# Patient Record
Sex: Female | Born: 1970
Health system: Southern US, Community
[De-identification: ages and names within clinical notes are randomized; demographics above are authoritative.]

## PROBLEM LIST (undated history)

## (undated) DIAGNOSIS — G43909 Migraine, unspecified, not intractable, without status migrainosus: Secondary | ICD-10-CM

## (undated) DIAGNOSIS — C801 Malignant (primary) neoplasm, unspecified: Secondary | ICD-10-CM

## (undated) DIAGNOSIS — G510 Bell's palsy: Secondary | ICD-10-CM

## (undated) DIAGNOSIS — F419 Anxiety disorder, unspecified: Secondary | ICD-10-CM

## (undated) DIAGNOSIS — A64 Unspecified sexually transmitted disease: Secondary | ICD-10-CM

## (undated) HISTORY — DX: Unspecified sexually transmitted disease: A64

## (undated) HISTORY — PX: BARTHOLIN GLAND CYST EXCISION: SHX565

## (undated) HISTORY — DX: Malignant (primary) neoplasm, unspecified: C80.1

## (undated) HISTORY — DX: Bell's palsy: G51.0

## (undated) HISTORY — DX: Migraine, unspecified, not intractable, without status migrainosus: G43.909

## (undated) HISTORY — DX: Anxiety disorder, unspecified: F41.9

---

## 1999-06-06 ENCOUNTER — Other Ambulatory Visit: Admission: RE | Admit: 1999-06-06 | Discharge: 1999-06-06 | Payer: Self-pay | Admitting: *Deleted

## 2000-06-25 ENCOUNTER — Other Ambulatory Visit: Admission: RE | Admit: 2000-06-25 | Discharge: 2000-06-25 | Payer: Self-pay | Admitting: *Deleted

## 2001-07-14 ENCOUNTER — Other Ambulatory Visit: Admission: RE | Admit: 2001-07-14 | Discharge: 2001-07-14 | Payer: Self-pay | Admitting: *Deleted

## 2002-06-01 ENCOUNTER — Inpatient Hospital Stay (HOSPITAL_COMMUNITY): Admission: AD | Admit: 2002-06-01 | Discharge: 2002-06-04 | Payer: Self-pay | Admitting: *Deleted

## 2002-07-16 ENCOUNTER — Other Ambulatory Visit: Admission: RE | Admit: 2002-07-16 | Discharge: 2002-07-16 | Payer: Self-pay | Admitting: *Deleted

## 2003-09-13 ENCOUNTER — Other Ambulatory Visit: Admission: RE | Admit: 2003-09-13 | Discharge: 2003-09-13 | Payer: Self-pay | Admitting: *Deleted

## 2004-08-21 ENCOUNTER — Inpatient Hospital Stay (HOSPITAL_COMMUNITY): Admission: RE | Admit: 2004-08-21 | Discharge: 2004-08-24 | Payer: Self-pay | Admitting: Obstetrics and Gynecology

## 2004-09-25 ENCOUNTER — Other Ambulatory Visit: Admission: RE | Admit: 2004-09-25 | Discharge: 2004-09-25 | Payer: Self-pay | Admitting: Obstetrics and Gynecology

## 2011-10-31 ENCOUNTER — Other Ambulatory Visit: Payer: Self-pay | Admitting: Obstetrics and Gynecology

## 2011-10-31 DIAGNOSIS — R928 Other abnormal and inconclusive findings on diagnostic imaging of breast: Secondary | ICD-10-CM

## 2011-11-08 ENCOUNTER — Other Ambulatory Visit: Payer: Self-pay | Admitting: Obstetrics and Gynecology

## 2011-11-08 ENCOUNTER — Ambulatory Visit
Admission: RE | Admit: 2011-11-08 | Discharge: 2011-11-08 | Disposition: A | Discharge status: Home / Self Care | Payer: Commercial Managed Care - PPO | Source: Ambulatory Visit | Attending: Obstetrics and Gynecology | Admitting: Obstetrics and Gynecology

## 2011-11-08 DIAGNOSIS — R928 Other abnormal and inconclusive findings on diagnostic imaging of breast: Secondary | ICD-10-CM

## 2011-11-08 HISTORY — PX: BREAST BIOPSY: SHX20

## 2013-06-26 ENCOUNTER — Telehealth: Payer: Self-pay | Admitting: Obstetrics and Gynecology

## 2013-06-26 NOTE — Telephone Encounter (Signed)
Pt came in today to receive the flu shot °

## 2013-08-11 ENCOUNTER — Other Ambulatory Visit: Payer: Self-pay | Admitting: Dermatology

## 2015-01-30 ENCOUNTER — Other Ambulatory Visit: Payer: Self-pay | Admitting: Obstetrics and Gynecology

## 2015-01-31 LAB — CYTOLOGY - PAP

## 2015-03-16 ENCOUNTER — Ambulatory Visit: Payer: Commercial Managed Care - HMO | Attending: Orthopedic Surgery | Admitting: Physical Therapy

## 2015-03-16 ENCOUNTER — Encounter: Payer: Self-pay | Admitting: Physical Therapy

## 2015-03-16 DIAGNOSIS — R29898 Other symptoms and signs involving the musculoskeletal system: Secondary | ICD-10-CM | POA: Diagnosis present

## 2015-03-16 DIAGNOSIS — M25511 Pain in right shoulder: Secondary | ICD-10-CM | POA: Diagnosis not present

## 2015-03-16 NOTE — Therapy (Signed)
[facility] [address] [place], [place], [address] Phone: [phone]   Fax:  [phone]  Physical Therapy Evaluation  Patient Details  Name: Melissa [name] MRN: [id number] Date of Birth: [birth year] Referring Provider:  Netta Cedars, MD  Encounter Date: 03/16/2015      PT End of Session - 03/16/15 0755    Visit Number 1   Date for PT Re-Evaluation 04/27/15   PT Start Time 0800   PT Stop Time 0840   PT Time Calculation (min) 40 min   Activity Tolerance Patient tolerated treatment well   Behavior During Therapy [facility] for tasks assessed/performed      History reviewed. No pertinent past medical history.  Past Surgical History  Procedure Laterality Date  . Cesarean section      2 times    There were no vitals filed for this visit.  Visit Diagnosis:  Pain in joint, shoulder region, right - Plan: PT plan of care cert/re-cert  Weakness of shoulder - Plan: PT plan of care cert/re-cert      Subjective Assessment - 03/16/15 0803    Subjective Patient reports in the past month she started to have a constant pain in upper arm.  Patient reports the pain moved up into right shoulder with random movements.  Patient reports taking off shirts, taking off sports bra, reaching into back of car and reaching out.  Patient reports it feels like it is slipping out of joint. I hac cortisone shot yesterday.    Patient Stated Goals reduce pain to return to functional activities   Currently in Pain? Yes   Pain Score 1   high 9/10 worse   Pain Location Shoulder   Pain Orientation Right   Pain Descriptors / Indicators Stabbing;Nagging  heat   Pain Type Acute pain   Pain Radiating Towards radiates to ring and pinky finger   Pain Onset 1 to 4 weeks ago   Pain Frequency Constant   Aggravating Factors  reaching behind her, reaching overhead, pulling tops and bra off   Pain Relieving Factors arm at side   Effect of Pain on  Daily Activities reaching for items   Multiple Pain Sites No            [facility] PT Assessment - 03/16/15 0001    Assessment   Medical Diagnosis M75.41 Impingement syndrome of right shoulder   Onset Date/Surgical Date 02/13/15   Hand Dominance Right   Prior Therapy None   Precautions   Precautions None   Balance Screen   Has the patient fallen in the past 6 months No   Has the patient had a decrease in activity level because of a fear of falling?  No   Is the patient reluctant to leave their home because of a fear of falling?  No   Prior Function   Level of Independence Independent   Cognition   Overall Cognitive Status Within Functional Limits for tasks assessed   Observation/Other Assessments   Focus on Therapeutic Outcomes (FOTO)  31% limitation  goal is 26% limitation   Posture/Postural Control   Posture/Postural Control Postural limitations   Postural Limitations Rounded Shoulders;Forward head   ROM / Strength   AROM / PROM / Strength AROM;PROM;Strength   AROM   AROM Assessment Site Shoulder;Cervical   Right/Left Shoulder Right   Right Shoulder Flexion 152 Degrees   Right Shoulder ABduction 140 Degrees   Right Shoulder Internal Rotation 55 Degrees   Right Shoulder  External Rotation 85 Degrees   Cervical Flexion full   Cervical Extension decreased by 25%   Cervical - Right Side Bend decreased by 10%   Cervical - Left Side Bend decreased by 10%   Cervical - Right Rotation decreased by 10%   Cervical - Left Rotation full   PROM   PROM Assessment Site Shoulder   Right/Left Shoulder Right   Right Shoulder Flexion 175 Degrees  pain end range   Right Shoulder ABduction 180 Degrees  pain end range   Right Shoulder Internal Rotation 55 Degrees  pain  endrange   Right Shoulder External Rotation 90 Degrees   Strength   Strength Assessment Site Shoulder   Right/Left Shoulder Right   Right Shoulder Flexion 3+/5   Right Shoulder Extension 5/5   Right Shoulder ABduction  3+/5   Right Shoulder Internal Rotation 5/5   Right Shoulder External Rotation 4+/5   Palpation   Spinal mobility decreased mobility of T1-T3   Palpation comment tenderness located in right rotator cuff insertion, right A-C joint, right infraspinatus muscle, right posterior deltoid                   [facility] Adult PT Treatment/Exercise - 03/16/15 0001    Manual Therapy   Manual Therapy Soft tissue mobilization;Joint mobilization   Joint Mobilization right shoulder grade 3 for inferior glide, distraction, and posterior glide'   Soft tissue mobilization right RTC , deltoid, A-C joint                  PT Short Term Goals - 03/16/15 0109    PT SHORT TERM GOAL #1   Title independent with initial HEP   Time 3   Period Weeks   Status New   PT SHORT TERM GOAL #2   Title pain with reaching behind her decreased >/= 25%   Time 3   Period Weeks   Status New   PT SHORT TERM GOAL #3   Title pain with reaching upward decreased >/= 25%   Time 3   Period Weeks   Status New   PT SHORT TERM GOAL #4   Title taking off sports bra and shirt with pain decreased >/= 25%   Time 3   Period Weeks   Status New           PT Long Term Goals - 03/16/15 3235    PT LONG TERM GOAL #1   Title Independent with HEP and understand how to progress herself   Time 6   Period Weeks   Status New   PT LONG TERM GOAL #2   Title pain with reaching behind her in back seat of car decreased >/= 75%   Time 6   Period Weeks   Status New   PT LONG TERM GOAL #3   Title pain with taking off sports bra and shirt with decreased >/= 75%   Time 6   Period Weeks   Status New   PT LONG TERM GOAL #4   Title return to yoga due to strength of right arm >/= 4+/5   Time 6   Period Weeks   Status New   PT LONG TERM GOAL #5   Title reaching overhead with pain decreased >/= 75%   Time 6   Period Weeks   Status New               Plan - 03/16/15 0835    Clinical Impression Statement  Patient is  a 44 year old female with diagnosis of Impingement Syndrome of right shoulder that started 1 month ago suddenly.  Patient reports pain is 1-9/10 with reaching behind her, taking off shirt and sports bra and reaching overhead.  FOTO score is 31% limitation.  Right shoulder strength is flexion 3/5, abduction 3+/5 and external rotation 4+/5.  AROM of right shoulder is flexion 152, abduction 140, and internal rotation 50 with pain.  PROM for right shoulder is flexion 175, internal rotation 55 degrees with pain.  Decreased mobility of T1-T3 and right shoulder capsule.  Palpable tenderness located in right rotator cuff insertion, right A-C joint, right posterior deltoid, and right infraspinatus muscle.  Patient would benefit from physical therapy to improve right shoulder strength and ROM and decrease pain to restore prior functional level.    Pt will benefit from skilled therapeutic intervention in order to improve on the following deficits Increased fascial restricitons;Pain;Increased muscle spasms;Decreased mobility;Decreased endurance;Decreased activity tolerance;Decreased strength;Decreased range of motion;Impaired flexibility   Rehab Potential Excellent   Clinical Impairments Affecting Rehab Potential None   PT Frequency 2x / week   PT Duration 6 weeks   PT Treatment/Interventions Cryotherapy;Electrical Stimulation;Ultrasound;Moist Heat;Iontophoresis 4mg /ml Dexamethasone;Therapeutic activities;Therapeutic exercise;Neuromuscular re-education;Manual techniques;Patient/family education;Passive range of motion   PT Next Visit Plan soft tissue work, ROM, strengthenig   PT Home Exercise Plan shoulder ROM and strengthening exercises, pendulums   Recommended Other Services None   Consulted and Agree with Plan of Care Patient         Problem List There are no active problems to display for this patient.   Melissa [name],PT 03/16/2015, 8:43 AM  [facility] [address], [place], [place], [address] Phone: [phone]   Fax:  [phone]

## 2015-03-17 ENCOUNTER — Encounter: Payer: Self-pay | Admitting: Physical Therapy

## 2015-03-17 ENCOUNTER — Ambulatory Visit: Payer: Commercial Managed Care - PPO | Admitting: Physical Therapy

## 2015-03-17 ENCOUNTER — Ambulatory Visit: Payer: Commercial Managed Care - HMO | Admitting: Physical Therapy

## 2015-03-17 DIAGNOSIS — M25511 Pain in right shoulder: Secondary | ICD-10-CM | POA: Diagnosis not present

## 2015-03-17 DIAGNOSIS — R29898 Other symptoms and signs involving the musculoskeletal system: Secondary | ICD-10-CM

## 2015-03-17 NOTE — Therapy (Signed)
[facility] [address] [place], [place], [address] Phone: [phone]   Fax:  [phone]  Physical Therapy Treatment  Patient Details  Name: Melissa [name] MRN: [id number] Date of Birth: Feb 21, 1971 Referring Provider:  Netta Cedars, MD  Encounter Date: 03/17/2015      PT End of Session - 03/17/15 0805    Visit Number 2   Date for PT Re-Evaluation 04/27/15   PT Start Time 0800   PT Stop Time 0840   PT Time Calculation (min) 40 min   Activity Tolerance Patient tolerated treatment well   Behavior During Therapy [facility] for tasks assessed/performed      History reviewed. No pertinent past medical history.  Past Surgical History  Procedure Laterality Date  . Cesarean section      2 times    There were no vitals filed for this visit.  Visit Diagnosis:  Pain in joint, shoulder region, right  Weakness of shoulder      Subjective Assessment - 03/17/15 0805    Subjective I was able to sleep better.  I reached for my purse and picked it up with less pain.    Patient Stated Goals reduce pain to return to functional activities   Currently in Pain? Yes   Pain Score 1    Pain Location Shoulder   Pain Orientation Right   Pain Descriptors / Indicators Discomfort   Pain Type Acute pain   Pain Radiating Towards radiates to ring and pinky finger   Pain Onset 1 to 4 weeks ago   Pain Frequency Constant   Aggravating Factors  reaching behind her, reaching overhead, pulling tops and bra off   Pain Relieving Factors arm at side   Effect of Pain on Daily Activities reaching for items   Multiple Pain Sites No                         [facility] Adult PT Treatment/Exercise - 03/17/15 0001    Shoulder Exercises: Standing   Horizontal ABduction 10 reps;Strengthening;Both;Theraband   Theraband Level (Shoulder Horizontal ABduction) Level 2 (Red)   External Rotation Strengthening;Right;10 reps  isometric  hold 5 sec   ABduction Strengthening;Right;10 reps  hold 5 sec isometric   Extension Strengthening;Both;10 reps;Theraband   Theraband Level (Shoulder Extension) Level 2 (Red)   Retraction Strengthening;Both;20 reps;Theraband   Theraband Level (Shoulder Retraction) Level 2 (Red)   Shoulder Exercises: ROM/Strengthening   UBE (Upper Arm Bike) level 1 3 min forward/3 min backward   Manual Therapy   Manual Therapy Soft tissue mobilization;Joint mobilization;Scapular mobilization   Joint Mobilization right shoulder grade 3 for inferior glide, distraction, and posterior glide'   Soft tissue mobilization upper trapezius, levator scapulae, upper trap, RTC, deltoid, and biceps   Scapular Mobilization lift scapula on medial border                PT Education - 03/17/15 0825    Education provided Yes   Education Details shoulder strengthening with red band and isometrics   Person(s) Educated Patient   Methods Explanation;Demonstration;Tactile cues;Verbal cues;Handout   Comprehension Returned demonstration;Verbalized understanding          PT Short Term Goals - 03/16/15 1950    PT SHORT TERM GOAL #1   Title independent with initial HEP   Time 3   Period Weeks   Status New   PT SHORT TERM GOAL #2   Title pain with reaching behind her decreased >/=  25%   Time 3   Period Weeks   Status New   PT SHORT TERM GOAL #3   Title pain with reaching upward decreased >/= 25%   Time 3   Period Weeks   Status New   PT SHORT TERM GOAL #4   Title taking off sports bra and shirt with pain decreased >/= 25%   Time 3   Period Weeks   Status New           PT Long Term Goals - 03/16/15 0354    PT LONG TERM GOAL #1   Title Independent with HEP and understand how to progress herself   Time 6   Period Weeks   Status New   PT LONG TERM GOAL #2   Title pain with reaching behind her in back seat of car decreased >/= 75%   Time 6   Period Weeks   Status New   PT LONG TERM GOAL #3    Title pain with taking off sports bra and shirt with decreased >/= 75%   Time 6   Period Weeks   Status New   PT LONG TERM GOAL #4   Title return to yoga due to strength of right arm >/= 4+/5   Time 6   Period Weeks   Status New   PT LONG TERM GOAL #5   Title reaching overhead with pain decreased >/= 75%   Time 6   Period Weeks   Status New               Plan - 03/17/15 0841    Clinical Impression Statement Patient is a 44  year old female with diagnosis of inpingement syndrome of right shoulder.  Patient is able to start exercises but is not able to go through range for horizontal abduction and do diagonal movements. Patient is only ready tfor isometrics for abduction and external rotation.  Patient has tightness in right upper trapezius and deltoid.  Patient will benefit from physical therapy to improve ROM and strength and decrease pain.    Pt will benefit from skilled therapeutic intervention in order to improve on the following deficits Increased fascial restricitons;Pain;Increased muscle spasms;Decreased mobility;Decreased endurance;Decreased activity tolerance;Decreased strength;Decreased range of motion;Impaired flexibility   Rehab Potential Excellent   Clinical Impairments Affecting Rehab Potential None   PT Frequency 2x / week   PT Duration 6 weeks   PT Treatment/Interventions Cryotherapy;Electrical Stimulation;Ultrasound;Moist Heat;Iontophoresis 4mg /ml Dexamethasone;Therapeutic activities;Therapeutic exercise;Neuromuscular re-education;Manual techniques;Patient/family education;Passive range of motion   PT Next Visit Plan soft tissue work, ROM, strengthenig   PT Home Exercise Plan progress as needed   Consulted and Agree with Plan of Care Patient        Problem List There are no active problems to display for this patient.   Minah Axelrod,PT 03/17/2015, 8:45 AM  [facility] [address], [place], [place], [address] Phone: [phone]   Fax:  [phone]

## 2015-03-17 NOTE — Patient Instructions (Signed)
  Resisted Horizontal Abduction: Bilateral   Sit or stand, tubing in both hands, arms out in front. Keeping arms straight, pinch shoulder blades together and stretch arms out. Repeat _10___ times per set. Do 2____ sets per session. Do _1-2___ sessions per day.    Strengthening: Resisted Extension   Hold tubing in right hand, arm forward. Pull arm back, elbow straight. Repeat _10___ times per set. Do _2___ sets per session. Do _1-2___ sessions per day.  Can place band around the front of your body and perform with both arms at the same time.  Scapular Retraction: Rowing (Eccentric) - Arms - Side (Resistance Band)   Hold end of band in each hand. Pull back until elbows are even with trunk. Keep elbows by sides, thumbs up. Slowly release for 3-5 seconds. Use __red______ resistance band. _10-20__ reps per set, _1__ sets per day, __1_ days per week.   Copyright  VHI. All rights reserved.  External Rotation (Isometric)   Place back of left fist against door frame, with elbow bent. Press fist against door frame. Hold _5___ seconds. Repeat _10___ times. Do __1__ sessions per day.  SHOULDER: Abduction (Isometric)   Use wall as resistance. Press arm against pillow. Keep elbow straight. Hold _5__ seconds. _10__ reps per set, __1_ sets per day, _1__ days per week  Copyright  VHI. All rights reserved.       http://gt2.exer.us/110   Copyright  VHI. All rights reserved.   [facility] [address], [place], [place] [address] Phone # [phone] Fax [phone]

## 2015-03-22 ENCOUNTER — Ambulatory Visit: Payer: Commercial Managed Care - HMO | Admitting: Physical Therapy

## 2015-03-22 ENCOUNTER — Encounter: Payer: Self-pay | Admitting: Physical Therapy

## 2015-03-22 DIAGNOSIS — M25511 Pain in right shoulder: Secondary | ICD-10-CM

## 2015-03-22 DIAGNOSIS — R29898 Other symptoms and signs involving the musculoskeletal system: Secondary | ICD-10-CM

## 2015-03-22 NOTE — Therapy (Signed)
[facility] [address] [place], [place], [address] Phone: [phone]   Fax:  [phone]  Physical Therapy Treatment  Patient Details  Name: Melissa [name] MRN: [id number] Date of Birth: May 11, 1971 Referring Provider:  Netta Cedars, MD  Encounter Date: 03/22/2015      PT End of Session - 03/22/15 1513    Visit Number 3   Date for PT Re-Evaluation 04/27/15   PT Start Time 1450   PT Stop Time 1530   PT Time Calculation (min) 40 min   Activity Tolerance Patient tolerated treatment well   Behavior During Therapy [facility] for tasks assessed/performed      History reviewed. No pertinent past medical history.  Past Surgical History  Procedure Laterality Date  . Cesarean section      2 times    There were no vitals filed for this visit.  Visit Diagnosis:  Pain in joint, shoulder region, right  Weakness of shoulder      Subjective Assessment - 03/22/15 1458    Subjective Pt reports 60% improvement with Rt shoulder. With reaching behind and slighly rotational component pain incr in Rt shoulder   Currently in Pain? Yes   Pain Score 1   up to 8-9/10 with extension and IR, Rt UE   Pain Location Shoulder   Pain Orientation Right   Pain Descriptors / Indicators Shooting   Pain Type Acute pain   Multiple Pain Sites No                         [facility] Adult PT Treatment/Exercise - 03/22/15 0001    Shoulder Exercises: Standing   Horizontal ABduction 10 reps;Strengthening;Both;Theraband   Theraband Level (Shoulder Horizontal ABduction) Level 2 (Red)   External Rotation Strengthening;Right;10 reps  isometric 5 sec hold   ABduction Strengthening;Right;10 reps  5 sec hold isometrics   Extension Strengthening;Both;Theraband;20 reps   Theraband Level (Shoulder Extension) Level 2 (Red)   Retraction Strengthening;Both;20 reps;Theraband   Theraband Level (Shoulder Retraction) Level 2 (Red)   Shoulder Exercises: ROM/Strengthening   UBE (Upper Arm Bike) level 1 3 min forward/3 min backward   Manual Therapy   Manual Therapy Soft tissue mobilization;Joint mobilization;Scapular mobilization   Joint Mobilization right shoulder grade 3 for inferior glide, distraction, and posterior glide'   Soft tissue mobilization upper trapezius, levator scapulae, upper trap, RTC, deltoid, and biceps   Scapular Mobilization lift scapula on medial border                  PT Short Term Goals - 03/22/15 1536    PT SHORT TERM GOAL #1   Title independent with initial HEP   Time 3   Period Weeks   Status On-going   PT SHORT TERM GOAL #2   Title pain with reaching behind her decreased >/= 25%   Time 3   Period Weeks   Status On-going   PT SHORT TERM GOAL #3   Title pain with reaching upward decreased >/= 25%   Time 3   Period Weeks   Status On-going   PT SHORT TERM GOAL #4   Title taking off sports bra and shirt with pain decreased >/= 25%   Time 3   Period Weeks   Status On-going           PT Long Term Goals - 03/16/15 6962    PT LONG TERM GOAL #1   Title Independent with HEP and understand  how to progress herself   Time 6   Period Weeks   Status New   PT LONG TERM GOAL #2   Title pain with reaching behind her in back seat of car decreased >/= 75%   Time 6   Period Weeks   Status New   PT LONG TERM GOAL #3   Title pain with taking off sports bra and shirt with decreased >/= 75%   Time 6   Period Weeks   Status New   PT LONG TERM GOAL #4   Title return to yoga due to strength of right arm >/= 4+/5   Time 6   Period Weeks   Status New   PT LONG TERM GOAL #5   Title reaching overhead with pain decreased >/= 75%   Time 6   Period Weeks   Status New               Plan - 03/22/15 1514    Clinical Impression Statement Patient is a 44 y.o. female with diagnosis of inpingement syndrom of right shoulder. Pt was able to perform isometric exercises with ease  today, and able to perform bil horizontal abduction without pain.    Pt will benefit from skilled therapeutic intervention in order to improve on the following deficits Increased fascial restricitons;Pain;Increased muscle spasms;Decreased mobility;Decreased endurance;Decreased activity tolerance;Decreased strength;Decreased range of motion;Impaired flexibility   PT Frequency 2x / week   PT Duration 6 weeks   PT Treatment/Interventions Cryotherapy;Electrical Stimulation;Ultrasound;Moist Heat;Iontophoresis 4mg /ml Dexamethasone;Therapeutic activities;Therapeutic exercise;Neuromuscular re-education;Manual techniques;Patient/family education;Passive range of motion   PT Next Visit Plan Continue with isometric strength into ER and Rt UE strength, softtissue work and joint mob Rt shoulder   Consulted and Agree with Plan of Care Patient        Problem List There are no active problems to display for this patient.   NAUMANN-HOUEGNIFIO,Kedron Uno PTA 03/22/2015, 3:47 PM  [facility] [address] [place], [place], [address] Phone: [phone]   Fax:  [phone]

## 2015-03-24 ENCOUNTER — Ambulatory Visit: Payer: Commercial Managed Care - HMO | Admitting: Physical Therapy

## 2015-03-24 DIAGNOSIS — M25511 Pain in right shoulder: Secondary | ICD-10-CM | POA: Diagnosis not present

## 2015-03-24 DIAGNOSIS — R29898 Other symptoms and signs involving the musculoskeletal system: Secondary | ICD-10-CM

## 2015-03-24 NOTE — Therapy (Signed)
[facility] [address] [place], [place], [address] Phone: [phone]   Fax:  [phone]  Physical Therapy Treatment  Patient Details  Name: Melissa [name] MRN: [id number] Date of Birth: [birth year] Referring Provider:  Netta Cedars, MD  Encounter Date: 03/24/2015      PT End of Session - 03/24/15 1010    Visit Number 4   Date for PT Re-Evaluation 04/27/15   PT Start Time 0930   PT Stop Time 1010   PT Time Calculation (min) 40 min   Activity Tolerance Patient tolerated treatment well   Behavior During Therapy [facility] for tasks assessed/performed      No past medical history on file.  Past Surgical History  Procedure Laterality Date   Cesarean section      2 times    There were no vitals filed for this visit.  Visit Diagnosis:  Pain in joint, shoulder region, right  Weakness of shoulder      Subjective Assessment - 03/24/15 0933    Subjective Notes improvement overall with PT and following cortisone injection.  Does still have intermittent pain with combined abduciton and Er IE reaching into the back seat from the drivers seatr   Currently in Pain? No/denies   Pain Location Shoulder   Pain Orientation Right   Pain Descriptors / Indicators Shooting   Pain Type Acute pain   Pain Frequency Intermittent   Aggravating Factors  reaching, particularly with combined Er and abduction when reaching behind her   Pain Relieving Factors not reaching                         [facility] Adult PT Treatment/Exercise - 03/24/15 0001    Shoulder Exercises: Supine   Protraction Right;20 reps;Weights  6#   Other Supine Exercises scapular retraction isometric 5 sec 12x   Shoulder Exercises: Seated   Horizontal ABduction Both;20 reps;Theraband   Theraband Level (Shoulder Horizontal ABduction) Level 2 (Red)  2x10 thumbs out   Other Seated Exercises UBE L4 x4'   Other Seated Exercises bilateral H abd  thumbs in.  red tband 2x10   Shoulder Exercises: Standing   Extension Strengthening;Both;20 reps;Theraband   Theraband Level (Shoulder Extension) Level 2 (Red)   Retraction Strengthening;Both;20 reps;Theraband   Theraband Level (Shoulder Retraction) Level 2 (Red)   Other Standing Exercises wall push up with scap retraction 2x10   Other Standing Exercises corner pec stretch; 3x30 sec                PT Education - 03/24/15 1009    Education Details postural awareness and correction for sitting/ standing.   Person(s) Educated Patient   Methods Explanation;Demonstration;Tactile cues;Verbal cues   Comprehension Returned demonstration;Verbalized understanding          PT Short Term Goals - 03/22/15 1536    PT SHORT TERM GOAL #1   Title independent with initial HEP   Time 3   Period Weeks   Status On-going   PT SHORT TERM GOAL #2   Title pain with reaching behind her decreased >/= 25%   Time 3   Period Weeks   Status On-going   PT SHORT TERM GOAL #3   Title pain with reaching upward decreased >/= 25%   Time 3   Period Weeks   Status On-going   PT SHORT TERM GOAL #4   Title taking off sports bra and shirt with pain decreased >/= 25%  Time 3   Period Weeks   Status On-going           PT Long Term Goals - 03/16/15 0824    PT LONG TERM GOAL #1   Title Independent with HEP and understand how to progress herself   Time 6   Period Weeks   Status New   PT LONG TERM GOAL #2   Title pain with reaching behind her in back seat of car decreased >/= 75%   Time 6   Period Weeks   Status New   PT LONG TERM GOAL #3   Title pain with taking off sports bra and shirt with decreased >/= 75%   Time 6   Period Weeks   Status New   PT LONG TERM GOAL #4   Title return to yoga due to strength of right arm >/= 4+/5   Time 6   Period Weeks   Status New   PT LONG TERM GOAL #5   Title reaching overhead with pain decreased >/= 75%   Time 6   Period Weeks   Status New                Plan - 03/24/15 1010    Clinical Impression Statement With verbal and tactile cues; patient able to perform corrective exercises without c/o pain.     Pt will benefit from skilled therapeutic intervention in order to improve on the following deficits Increased fascial restricitons;Pain;Increased muscle spasms;Decreased mobility;Decreased endurance;Decreased activity tolerance;Decreased strength;Decreased range of motion;Impaired flexibility   Rehab Potential Excellent        Problem List There are no active problems to display for this patient.   Olean Ree, PTA 03/24/2015, 10:17 AM All treatment provided by Cherrie Gauze, PTA on 03/24/15  [facility] [address] [place], [place], [address] Phone: [phone]   Fax:  [phone]

## 2015-03-28 ENCOUNTER — Ambulatory Visit: Payer: Commercial Managed Care - HMO | Admitting: Physical Therapy

## 2015-03-28 ENCOUNTER — Encounter: Payer: Self-pay | Admitting: Physical Therapy

## 2015-03-28 DIAGNOSIS — M25511 Pain in right shoulder: Secondary | ICD-10-CM | POA: Diagnosis not present

## 2015-03-28 DIAGNOSIS — R29898 Other symptoms and signs involving the musculoskeletal system: Secondary | ICD-10-CM

## 2015-03-28 NOTE — Therapy (Signed)
[facility] [address] [place], [place], [address] Phone: [phone]   Fax:  [phone]  Physical Therapy Treatment  Patient Details  Name: SELENE PELTZER MRN: [id number] Date of Birth: 1970-10-03 Referring Provider:  Netta Cedars, MD  Encounter Date: 03/28/2015      PT End of Session - 03/28/15 1020    Visit Number 5   Date for PT Re-Evaluation 04/27/15   PT Start Time 2330   PT Stop Time 1100   PT Time Calculation (min) 45 min   Activity Tolerance Patient tolerated treatment well   Behavior During Therapy [facility] for tasks assessed/performed      History reviewed. No pertinent past medical history.  Past Surgical History  Procedure Laterality Date  . Cesarean section      2 times    There were no vitals filed for this visit.  Visit Diagnosis:  Pain in joint, shoulder region, right  Weakness of shoulder      Subjective Assessment - 03/28/15 1017    Subjective Definitely feeling better, the reaching is much better rated as 90%.    Currently in Pain? No/denies                         [facility] Adult PT Treatment/Exercise - 03/28/15 0001    Shoulder Exercises: Supine   Protraction Right;20 reps;Weights  6# added   Other Supine Exercises Tree Hug 2# 2x10   ABD into ADD bil   Shoulder Exercises: Seated   Horizontal ABduction --   Theraband Level (Shoulder Horizontal ABduction) --   Other Seated Exercises --   Shoulder Exercises: Standing   Extension --   Theraband Level (Shoulder Extension) --   Row Strengthening;20 reps;Weights  25#   Retraction Strengthening;Both;20 reps;Theraband   Theraband Level (Shoulder Retraction) Level 2 (Red)   Other Standing Exercises wall push up with scap retraction 2x10  Standing infront of mirrow with 2# bil elbow flex/ER 2 x 10   Other Standing Exercises pec stretch; 3x30 sec x 2 on doorframe   Shoulder Exercises: ROM/Strengthening   UBE  (Upper Arm Bike) level 3  55min (3/3)    Manual Therapy   Manual Therapy Soft tissue mobilization;Joint mobilization;Scapular mobilization   Joint Mobilization right shoulder grade 3 for inferior glide, distraction, and posterior glide'   Soft tissue mobilization upper trapezius, levator scapulae, upper trap, RTC, deltoid, and biceps   Scapular Mobilization lift scapula on medial border                  PT Short Term Goals - 03/28/15 1025    PT SHORT TERM GOAL #1   Title independent with initial HEP   Time 3   Period Weeks   Status Achieved   PT SHORT TERM GOAL #2   Title pain with reaching behind her decreased >/= 25%   Time 3   Period Weeks   Status Achieved   PT SHORT TERM GOAL #3   Title pain with reaching upward decreased >/= 25%   Time 3   Period Weeks   Status Achieved   PT SHORT TERM GOAL #4   Title taking off sports bra and shirt with pain decreased >/= 25%   Time 3   Period Weeks   Status Achieved           PT Long Term Goals - 03/28/15 1027    PT LONG TERM GOAL #1   Title  Independent with HEP and understand how to progress herself   Time 6   Period Weeks   Status On-going   PT LONG TERM GOAL #2   Title pain with reaching behind her in back seat of car decreased >/= 75%   Time 6   Period Weeks   Status Achieved   PT LONG TERM GOAL #3   Title pain with taking off sports bra and shirt with decreased >/= 75%   Time 6   Period Weeks   Status Achieved   PT LONG TERM GOAL #4   Title return to yoga due to strength of right arm >/= 4+/5   Time 6   Period Weeks   Status On-going   PT LONG TERM GOAL #5   Title reaching overhead with pain decreased >/= 75%   Time 6   Period Weeks   Status On-going               Plan - 03/28/15 1021    Clinical Impression Statement Pt able to tolerate all activities in PT clinic without limitations   Pt will benefit from skilled therapeutic intervention in order to improve on the following deficits  Increased fascial restricitons;Pain;Increased muscle spasms;Decreased mobility;Decreased endurance;Decreased activity tolerance;Decreased strength;Decreased range of motion;Impaired flexibility   Rehab Potential Excellent   PT Frequency 2x / week   PT Duration 6 weeks   PT Treatment/Interventions Cryotherapy;Electrical Stimulation;Ultrasound;Moist Heat;Iontophoresis 4mg /ml Dexamethasone;Therapeutic activities;Therapeutic exercise;Neuromuscular re-education;Manual techniques;Patient/family education;Passive range of motion   PT Next Visit Plan d/c if no flare up   Consulted and Agree with Plan of Care Patient        Problem List There are no active problems to display for this patient.   NAUMANN-HOUEGNIFIO,Eastyn Dattilo PTA 03/28/2015, 11:49 AM  [facility] [address] [place], [place], [address] Phone: [phone]   Fax:  [phone]

## 2015-03-30 ENCOUNTER — Ambulatory Visit: Payer: Commercial Managed Care - HMO | Admitting: Physical Therapy

## 2015-03-30 ENCOUNTER — Encounter: Payer: Self-pay | Admitting: Physical Therapy

## 2015-03-30 DIAGNOSIS — M25511 Pain in right shoulder: Secondary | ICD-10-CM

## 2015-03-30 DIAGNOSIS — R29898 Other symptoms and signs involving the musculoskeletal system: Secondary | ICD-10-CM

## 2015-03-30 NOTE — Therapy (Signed)
[facility] [address] [place], [place], [address] Phone: [phone]   Fax:  [phone]  Physical Therapy Treatment  Patient Details  Name: Melissa [name] MRN: [id number] Date of Birth: 05/06/1971 Referring Provider:  Netta Cedars, MD  Encounter Date: 03/30/2015      PT End of Session - 03/30/15 1016    Visit Number 6   Date for PT Re-Evaluation 04/27/15   PT Start Time 0845   PT Stop Time 0929   PT Time Calculation (min) 44 min   Activity Tolerance Patient tolerated treatment well   Behavior During Therapy [facility] for tasks assessed/performed      History reviewed. No pertinent past medical history.  Past Surgical History  Procedure Laterality Date  . Cesarean section      2 times    There were no vitals filed for this visit.  Visit Diagnosis:  Pain in joint, shoulder region, right  Weakness of shoulder      Subjective Assessment - 03/30/15 1012    Subjective Pt rates her improvement as 100%, only sometimes feeling a slight discomfort on Rt deltoid insertion. Pt is ready to be D/C    Currently in Pain? No/denies   Multiple Pain Sites No            [facility] PT Assessment - 03/30/15 0001    Assessment   Medical Diagnosis M75.41 Impingement syndrome of right shoulder   Onset Date/Surgical Date 02/13/15   Hand Dominance Right   Prior Therapy None   Precautions   Precautions None   Balance Screen   Has the patient fallen in the past 6 months No   Has the patient had a decrease in activity level because of a fear of falling?  No   Is the patient reluctant to leave their home because of a fear of falling?  No   Prior Function   Level of Independence Independent   Cognition   Overall Cognitive Status Within Functional Limits for tasks assessed   Observation/Other Assessments   Focus on Therapeutic Outcomes (FOTO)  31% limitation   Posture/Postural Control   Posture Comments Pt with good  posture   ROM / Strength   AROM / PROM / Strength AROM   AROM   AROM Assessment Site Shoulder   Right/Left Shoulder Right   Right Shoulder Flexion 175 Degrees   Right Shoulder ABduction 170 Degrees   Right Shoulder Internal Rotation 85 Degrees   Right Shoulder External Rotation 90 Degrees   Cervical Flexion full   Cervical Extension full   Cervical - Right Side Bend full   Cervical - Left Side Bend full   Cervical - Right Rotation full   Cervical - Left Rotation full   Strength   Strength Assessment Site Shoulder   Right/Left Shoulder Right   Right Shoulder Flexion 5/5   Right Shoulder Extension 5/5   Right Shoulder ABduction 5/5   Right Shoulder Internal Rotation 5/5   Right Shoulder External Rotation 5/5                     [facility] Adult PT Treatment/Exercise - 03/30/15 0001    Shoulder Exercises: Supine   Protraction Right;20 reps;Weights  6#added   Other Supine Exercises Tree Hug 2# 2x10   ABD into ADD bil   Shoulder Exercises: Standing   Row Strengthening;20 reps;Weights  30# sitting on green physio ball   Other Standing Exercises wall push up with scap  retraction 2x10   Other Standing Exercises pec stretch; 3x30 sec x 2 on doorframe   Shoulder Exercises: ROM/Strengthening   UBE (Upper Arm Bike) level 3  95mn (3/3)    Manual Therapy   Manual Therapy Soft tissue mobilization;Joint mobilization;Scapular mobilization   Joint Mobilization right shoulder grade 3 for inferior glide, distraction, and posterior glide'   Soft tissue mobilization upper trapezius, levator scapulae, upper trap, RTC, deltoid, and biceps   Scapular Mobilization lift scapula on medial border                  PT Short Term Goals - 03/28/15 1025    PT SHORT TERM GOAL #1   Title independent with initial HEP   Time 3   Period Weeks   Status Achieved   PT SHORT TERM GOAL #2   Title pain with reaching behind her decreased >/= 25%   Time 3   Period Weeks   Status Achieved    PT SHORT TERM GOAL #3   Title pain with reaching upward decreased >/= 25%   Time 3   Period Weeks   Status Achieved   PT SHORT TERM GOAL #4   Title taking off sports bra and shirt with pain decreased >/= 25%   Time 3   Period Weeks   Status Achieved           PT Long Term Goals - 03/30/15 01505   PT LONG TERM GOAL #1   Title Independent with HEP and understand how to progress herself   Time 6   Period Weeks   Status Achieved   PT LONG TERM GOAL #2   Title pain with reaching behind her in back seat of car decreased >/= 75%   Time 6   Period Weeks   Status Achieved   PT LONG TERM GOAL #3   Title pain with taking off sports bra and shirt with decreased >/= 75%   Time 6   Period Weeks   Status Achieved   PT LONG TERM GOAL #4   Title return to yoga due to strength of right arm >/= 4+/5   Time 6   Period Weeks   Status Achieved   PT LONG TERM GOAL #5   Title reaching overhead with pain decreased >/= 75%   Time 6   Period Weeks   Status Achieved               Plan - 03/30/15 1018    Clinical Impression Statement Pt with full ROM in Rt shoulder and strength in all planes 5/5. Foto score is 12 %.    Pt will benefit from skilled therapeutic intervention in order to improve on the following deficits Increased fascial restricitons;Pain;Increased muscle spasms;Decreased mobility;Decreased endurance;Decreased activity tolerance;Decreased strength;Decreased range of motion;Impaired flexibility   Rehab Potential Excellent   PT Frequency 2x / week   PT Duration 6 weeks   PT Treatment/Interventions Cryotherapy;Electrical Stimulation;Ultrasound;Moist Heat;Iontophoresis 44mml Dexamethasone;Therapeutic activities;Therapeutic exercise;Neuromuscular re-education;Manual techniques;Patient/family education;Passive range of motion   PT Next Visit Plan D/C   Consulted and Agree with Plan of Care Patient        Problem List There are no active problems to display for this  patient.  Jassiah Viviano Naumann-Houegnifio, PTA 03/30/2015 10:32 AM    [facility] [address]: [phone] Fax:  [phone]   PHYSICAL THERAPY DISCHARGE SUMMARY  Visits from Start of Care:  6  Current functional level related to goals / functional outcomes: See above. All goals met.   Remaining deficits: See above/   Education / Equipment:HEP Plan: Patient agrees to discharge.  Patient goals were met. Patient is being discharged due to meeting the stated rehab goals.  Thank you for the referral. Earlie Counts, PT 03/30/2015 11:04 AM  ?????

## 2015-04-04 ENCOUNTER — Encounter: Payer: Commercial Managed Care - PPO | Admitting: Physical Therapy

## 2015-04-06 ENCOUNTER — Encounter: Payer: Commercial Managed Care - PPO | Admitting: Physical Therapy

## 2015-12-29 MED FILL — SERTRALINE HCL 100 MG TAB: 100 | 30 days supply | Qty: 30 | Fill #0

## 2016-04-01 DIAGNOSIS — Z1231 Encounter for screening mammogram for malignant neoplasm of breast: Secondary | ICD-10-CM | POA: Diagnosis not present

## 2016-04-01 DIAGNOSIS — Z30431 Encounter for routine checking of intrauterine contraceptive device: Secondary | ICD-10-CM | POA: Diagnosis not present

## 2016-04-01 DIAGNOSIS — Z01419 Encounter for gynecological examination (general) (routine) without abnormal findings: Secondary | ICD-10-CM | POA: Diagnosis not present

## 2016-04-01 DIAGNOSIS — L659 Nonscarring hair loss, unspecified: Secondary | ICD-10-CM | POA: Diagnosis not present

## 2016-04-01 DIAGNOSIS — Z6826 Body mass index (BMI) 26.0-26.9, adult: Secondary | ICD-10-CM | POA: Diagnosis not present

## 2016-04-10 MED FILL — SERTRALINE HCL 100 MG TAB: 100 | 30 days supply | Qty: 30 | Fill #1

## 2016-07-04 MED FILL — SERTRALINE HCL 100 MG TAB: 100 | 30 days supply | Qty: 30 | Fill #2

## 2016-09-17 MED FILL — SERTRALINE HCL 100 MG TAB: 100 | 30 days supply | Qty: 30 | Fill #3

## 2016-10-02 DIAGNOSIS — L918 Other hypertrophic disorders of the skin: Secondary | ICD-10-CM | POA: Diagnosis not present

## 2016-10-02 DIAGNOSIS — Z86018 Personal history of other benign neoplasm: Secondary | ICD-10-CM | POA: Diagnosis not present

## 2016-10-02 DIAGNOSIS — D225 Melanocytic nevi of trunk: Secondary | ICD-10-CM | POA: Diagnosis not present

## 2016-10-02 DIAGNOSIS — Z23 Encounter for immunization: Secondary | ICD-10-CM | POA: Diagnosis not present

## 2016-10-02 DIAGNOSIS — L821 Other seborrheic keratosis: Secondary | ICD-10-CM | POA: Diagnosis not present

## 2016-10-02 DIAGNOSIS — Z85820 Personal history of malignant melanoma of skin: Secondary | ICD-10-CM | POA: Diagnosis not present

## 2016-10-02 DIAGNOSIS — L814 Other melanin hyperpigmentation: Secondary | ICD-10-CM | POA: Diagnosis not present

## 2016-10-02 DIAGNOSIS — D1800 Hemangioma unspecified site: Secondary | ICD-10-CM | POA: Diagnosis not present

## 2017-07-17 DIAGNOSIS — Z1231 Encounter for screening mammogram for malignant neoplasm of breast: Secondary | ICD-10-CM | POA: Diagnosis not present

## 2017-07-17 DIAGNOSIS — Z01419 Encounter for gynecological examination (general) (routine) without abnormal findings: Secondary | ICD-10-CM | POA: Diagnosis not present

## 2017-07-17 DIAGNOSIS — Z6828 Body mass index (BMI) 28.0-28.9, adult: Secondary | ICD-10-CM | POA: Diagnosis not present

## 2017-07-24 DIAGNOSIS — Z130 Encounter for screening for diseases of the blood and blood-forming organs and certain disorders involving the immune mechanism: Secondary | ICD-10-CM | POA: Diagnosis not present

## 2017-07-24 DIAGNOSIS — Z1329 Encounter for screening for other suspected endocrine disorder: Secondary | ICD-10-CM | POA: Diagnosis not present

## 2017-07-24 DIAGNOSIS — Z13228 Encounter for screening for other metabolic disorders: Secondary | ICD-10-CM | POA: Diagnosis not present

## 2017-07-24 DIAGNOSIS — Z131 Encounter for screening for diabetes mellitus: Secondary | ICD-10-CM | POA: Diagnosis not present

## 2017-09-15 MED FILL — VALACYCLOVIR HCL 500 MG TAB: 500 | 15 days supply | Qty: 30 | Fill #0

## 2017-10-27 DIAGNOSIS — D485 Neoplasm of uncertain behavior of skin: Secondary | ICD-10-CM | POA: Diagnosis not present

## 2017-10-27 DIAGNOSIS — D225 Melanocytic nevi of trunk: Secondary | ICD-10-CM | POA: Diagnosis not present

## 2017-10-27 DIAGNOSIS — L918 Other hypertrophic disorders of the skin: Secondary | ICD-10-CM | POA: Diagnosis not present

## 2017-10-27 DIAGNOSIS — L821 Other seborrheic keratosis: Secondary | ICD-10-CM | POA: Diagnosis not present

## 2017-10-27 DIAGNOSIS — D1800 Hemangioma unspecified site: Secondary | ICD-10-CM | POA: Diagnosis not present

## 2017-10-27 DIAGNOSIS — L814 Other melanin hyperpigmentation: Secondary | ICD-10-CM | POA: Diagnosis not present

## 2017-10-27 DIAGNOSIS — L905 Scar conditions and fibrosis of skin: Secondary | ICD-10-CM | POA: Diagnosis not present

## 2017-10-27 DIAGNOSIS — Z86018 Personal history of other benign neoplasm: Secondary | ICD-10-CM | POA: Diagnosis not present

## 2017-10-27 DIAGNOSIS — Z85820 Personal history of malignant melanoma of skin: Secondary | ICD-10-CM | POA: Diagnosis not present

## 2017-10-27 DIAGNOSIS — Z23 Encounter for immunization: Secondary | ICD-10-CM | POA: Diagnosis not present

## 2018-03-13 ENCOUNTER — Ambulatory Visit: Payer: Self-pay

## 2018-03-13 ENCOUNTER — Other Ambulatory Visit: Payer: Self-pay | Admitting: Occupational Medicine

## 2018-03-13 DIAGNOSIS — M25522 Pain in left elbow: Secondary | ICD-10-CM

## 2018-10-13 ENCOUNTER — Ambulatory Visit: Payer: Self-pay | Admitting: Physician Assistant

## 2018-10-13 ENCOUNTER — Encounter: Payer: Self-pay | Admitting: Physician Assistant

## 2018-10-13 VITALS — BP 127/78 | HR 66 | Temp 98.2°F | Resp 17

## 2018-10-13 DIAGNOSIS — J069 Acute upper respiratory infection, unspecified: Secondary | ICD-10-CM

## 2018-10-13 DIAGNOSIS — H6983 Other specified disorders of Eustachian tube, bilateral: Secondary | ICD-10-CM

## 2018-10-13 MED ORDER — PSEUDOEPH-BROMPHEN-DM 30-2-10 MG/5ML PO SYRP: 5.0000 mL | ORAL_SOLUTION | Freq: Four times a day (QID) | ORAL | 0 refills | Status: DC | PRN

## 2018-10-13 MED ORDER — SALINE SPRAY 0.65 % NA SOLN: 1.0000 | NASAL | 0 refills | Status: DC | PRN

## 2018-10-13 MED ORDER — FLUTICASONE PROPIONATE 50 MCG/ACT NA SUSP: 2.0000 | Freq: Every day | NASAL | 0 refills | Status: DC

## 2018-10-13 MED FILL — FLUTICASONE PROP 50 MCG SPR: 50 | 30 days supply | Qty: 16 | Fill #0

## 2018-10-13 MED FILL — BROMPHENIR-PSEUDOEPHED-DM S: 30-2-10 | 6 days supply | Qty: 120 | Fill #0

## 2018-10-13 NOTE — Progress Notes (Signed)
MRN: [id number] DOB: 18-Jun-1971  Subjective:   Melissa [name] is a 48 y.o. female presenting for chief complaint of Nasal Congestion (2 DAYS); Ear Fullness (2 DAYS); and Headache (STARTED TODAY) .  Reports 2 day history of scratchy throat, sinus headache, sinus congestion , ear fullness and dry cough. Has tried theraflu with no full relief. Denies fever, sinus pain, sore throat, inability to swallow, productive cough, wheezing, shortness of breath, chest tightness, chest pain and myalgia, nausea, vomiting, abdominal pain and diarrhea. No known sick contact exposure. Denies hx of seasonal allergies,asthma, COPD, heart disease, DM, and autoimmune disease.  Patient has had flu shot this season. Denies smoking. Denies recent travel. Has IUD in place. Denies any other aggravating or relieving factors, no other questions or concerns.  Review of Systems  Constitutional: Negative for diaphoresis.  Eyes: Negative for blurred vision, double vision and photophobia.  Skin: Negative for rash.  Neurological: Negative for dizziness, tingling and weakness.    [name] currently has no medications in their medication list. Also is allergic to shellfish allergy.  [name]  has no past medical history on file. Also  has a past surgical history that includes Cesarean section.   Objective:   Vitals: BP 127/78 (BP Location: Right Arm, Patient Position: Sitting, Cuff Size: Normal)   Pulse 66   Temp 98.2 F (36.8 C) (Oral)   Resp 17   SpO2 95%   Physical Exam Vitals signs reviewed.  Constitutional:      General: She is not in acute distress.    Appearance: She is well-developed. She is not ill-appearing or toxic-appearing.  HENT:     Head: Normocephalic and atraumatic.     Right Ear: Ear canal and external ear normal. No drainage or swelling. A middle ear effusion is present. Tympanic membrane is not erythematous or bulging.     Left Ear: Ear canal and external ear normal. No drainage or swelling.  A middle ear effusion is present. No mastoid tenderness. Tympanic membrane is scarred. Tympanic membrane is not perforated, erythematous or bulging.     Nose: Mucosal edema, congestion and rhinorrhea present. Rhinorrhea is clear.     Right Sinus: No maxillary sinus tenderness or frontal sinus tenderness.     Left Sinus: No maxillary sinus tenderness or frontal sinus tenderness.     Mouth/Throat:     Lips: Pink.     Mouth: Mucous membranes are moist.     Pharynx: Uvula midline. Posterior oropharyngeal erythema present. No oropharyngeal exudate or uvula swelling.     Tonsils: No tonsillar exudate or tonsillar abscesses. Swelling: 1+ on the right. 1+ on the left.  Eyes:     Conjunctiva/sclera: Conjunctivae normal.  Neck:     Musculoskeletal: Normal range of motion.  Cardiovascular:     Rate and Rhythm: Normal rate and regular rhythm.     Heart sounds: Normal heart sounds.  Pulmonary:     Effort: Pulmonary effort is normal.     Breath sounds: Normal breath sounds. No decreased breath sounds, wheezing, rhonchi or rales.  Lymphadenopathy:     Head:     Right side of head: No submental, submandibular, tonsillar, preauricular, posterior auricular or occipital adenopathy.     Left side of head: No submental, submandibular, tonsillar, preauricular, posterior auricular or occipital adenopathy.     Cervical: No cervical adenopathy.     Upper Body:     Right upper body: No supraclavicular adenopathy.     Left upper body: No supraclavicular adenopathy.  Skin:    General: Skin is warm and dry.  Neurological:     Mental Status: She is alert.     No results found for this or any previous visit (from the past 24 hour(s)).  Assessment and Plan :  1. Viral URI 2. Dysfunction of both eustachian tubes Patient is overall well-appearing, no acute distress.  VSS. History, sx duration, and physical exam are consistent with viral URI.  Given educational material on viral URI.  Recommend symptomatic  treatment at this time.  Advised to contact office if sinus pressure persists/worsens over the next 5-7 days, would consider Rx for antibiotic at that time.  Advised to follow-up in office, with family doctor, or local urgent care if no improvement in symptoms after 7 to 10 days.  Seek care sooner at local urgent care or ED if symptoms worsen/develop new concerning symptoms.  Patient voices understanding. - brompheniramine-pseudoephedrine-DM 30-2-10 MG/5ML syrup; Take 5 mLs by mouth 4 (four) times daily as needed.  Dispense: 120 mL; Refill: 0 - fluticasone (FLONASE) 50 MCG/ACT nasal spray; Place 2 sprays into both nostrils daily.  Dispense: 16 g; Refill: 0 - sodium chloride (OCEAN) 0.65 % SOLN nasal spray; Place 1 spray into both nostrils as needed.  Dispense: 60 mL; Refill: 0   [name], [facility] 10/13/2018 2:44 PM

## 2018-10-13 NOTE — Patient Instructions (Addendum)
Viral Respiratory Infection  Use bromfed syrup and flonase during the day for congestion, ear fullness, and cough.  Use nasal spray before bed.  I recommend over the counter zicam for immune support.  Make sure you are drinking water, getting plenty of rest, and eating light meals.  If your sinus pressure worsens over the next 5-7 days, contact our office.  If any of your other symptoms worsen or you develop new fever, productive cough, shortness of breath, or other concerning symptoms, seek care at ED.   A viral respiratory infection is an illness that affects parts of the body that are used for breathing. These include the lungs, nose, and throat. It is caused by a germ called a virus. Some examples of this kind of infection are:  A cold.  The flu (influenza).  A respiratory syncytial virus (RSV) infection. A person who gets this illness may have the following symptoms:  A stuffy or runny nose.  Yellow or green fluid in the nose.  A cough.  Sneezing.  Tiredness (fatigue).  Achy muscles.  A sore throat.  Sweating or chills.  A fever.  A headache. Follow these instructions at home: Managing pain and congestion  Take over-the-counter and prescription medicines only as told by your doctor.  If you have a sore throat, gargle with salt water. Do this 3-4 times per day or as needed. To make a salt-water mixture, dissolve -1 tsp of salt in 1 cup of warm water. Make sure that all the salt dissolves.  Use nose drops made from salt water. This helps with stuffiness (congestion). It also helps soften the skin around your nose.  Drink enough fluid to keep your pee (urine) pale yellow. General instructions   Rest as much as possible.  Do not drink alcohol.  Do not use any products that have nicotine or tobacco, such as cigarettes and e-cigarettes. If you need help quitting, ask your doctor.  Keep all follow-up visits as told by your doctor. This is important. How is  this prevented?   Get a flu shot every year. Ask your doctor when you should get your flu shot.  Do not let other people get your germs. If you are sick: ? Stay home from work or school. ? Wash your hands with soap and water often. Wash your hands after you cough or sneeze. If soap and water are not available, use hand sanitizer.  Avoid contact with people who are sick during cold and flu season. This is in fall and winter. Get help if:  Your symptoms last for 10 days or longer.  Your symptoms get worse over time.  You have a fever.  You have very bad pain in your face or forehead.  Parts of your jaw or neck become very swollen. Get help right away if:  You feel pain or pressure in your chest.  You have shortness of breath.  You faint or feel like you will faint.  You keep throwing up (vomiting).  You feel confused. Summary  A viral respiratory infection is an illness that affects parts of the body that are used for breathing.  Examples of this illness include a cold, the flu, and respiratory syncytial virus (RSV) infection.  The infection can cause a runny nose, cough, sneezing, sore throat, and fever.  Follow what your doctor tells you about taking medicines, drinking lots of fluid, washing your hands, resting at home, and avoiding people who are sick. This information is not intended to  replace advice given to you by your health care provider. Make sure you discuss any questions you have with your health care provider. Document Released: 07/04/2008 Document Revised: 09/01/2017 Document Reviewed: 09/01/2017 Elsevier Interactive Patient Education  2019 [employer].

## 2018-10-15 ENCOUNTER — Telehealth: Payer: Self-pay

## 2018-10-15 NOTE — Telephone Encounter (Signed)
Called and spoke with pt regarding how she was feeling since her visit with [place] and she states she is doing better but still has a cough.

## 2019-01-25 DIAGNOSIS — L814 Other melanin hyperpigmentation: Secondary | ICD-10-CM | POA: Diagnosis not present

## 2019-01-25 DIAGNOSIS — D225 Melanocytic nevi of trunk: Secondary | ICD-10-CM | POA: Diagnosis not present

## 2019-01-25 DIAGNOSIS — Z85820 Personal history of malignant melanoma of skin: Secondary | ICD-10-CM | POA: Diagnosis not present

## 2019-01-25 DIAGNOSIS — Z86018 Personal history of other benign neoplasm: Secondary | ICD-10-CM | POA: Diagnosis not present

## 2019-01-25 DIAGNOSIS — L821 Other seborrheic keratosis: Secondary | ICD-10-CM | POA: Diagnosis not present

## 2019-02-02 DIAGNOSIS — Z6828 Body mass index (BMI) 28.0-28.9, adult: Secondary | ICD-10-CM | POA: Diagnosis not present

## 2019-02-02 DIAGNOSIS — Z1231 Encounter for screening mammogram for malignant neoplasm of breast: Secondary | ICD-10-CM | POA: Diagnosis not present

## 2019-02-02 DIAGNOSIS — Z01419 Encounter for gynecological examination (general) (routine) without abnormal findings: Secondary | ICD-10-CM | POA: Diagnosis not present

## 2019-02-03 ENCOUNTER — Other Ambulatory Visit: Payer: Self-pay | Admitting: Obstetrics and Gynecology

## 2019-02-03 DIAGNOSIS — R928 Other abnormal and inconclusive findings on diagnostic imaging of breast: Secondary | ICD-10-CM

## 2019-02-11 ENCOUNTER — Ambulatory Visit: Payer: Commercial Managed Care - PPO

## 2019-02-11 ENCOUNTER — Ambulatory Visit
Admission: RE | Admit: 2019-02-11 | Discharge: 2019-02-11 | Disposition: A | Discharge status: Home / Self Care | Payer: 59 | Source: Ambulatory Visit | Attending: Obstetrics and Gynecology | Admitting: Obstetrics and Gynecology

## 2019-02-11 ENCOUNTER — Other Ambulatory Visit: Payer: Self-pay

## 2019-02-11 DIAGNOSIS — R928 Other abnormal and inconclusive findings on diagnostic imaging of breast: Secondary | ICD-10-CM | POA: Diagnosis not present

## 2019-05-13 NOTE — Progress Notes (Signed)
[name] [address] [place], [place] [address] Phone: [phone] Subjective:   Fontaine No, am serving as a scribe for Dr. Hulan Saas.  I'm seeing this patient by the request  of:    CC: Right shoulder pain  [id number]  Melissa [name] is a 48 y.o. female coming in with complaint of right shoulder pain. Patient states she has had pain for a few years. Was seen at [facility] at that time. Had cortisone injection and did PT. Shoulder pain resolved. Has been having pain again for 1.5 years. Pain with overhead motions such as serving in tennis. Does not feel pain with fore or backhand. Pain is dull at time but can be sharp at times. Occasionally has dull numbness in her right hand.   No past medical history on file. Past Surgical History:  Procedure Laterality Date  . CESAREAN SECTION     2 times   Social History   Socioeconomic History  . Marital status: Married    Spouse name: Not on file  . Number of children: Not on file  . Years of education: Not on file  . Highest education level: Not on file  Occupational History  . Not on file  Social Needs  . Financial resource strain: Not on file  . Food insecurity    Worry: Not on file    Inability: Not on file  . Transportation needs    Medical: Not on file    Non-medical: Not on file  Tobacco Use  . Smoking status: Never Smoker  . Smokeless tobacco: Never Used  Substance and Sexual Activity  . Alcohol use: Not on file  . Drug use: Not on file  . Sexual activity: Not on file  Lifestyle  . Physical activity    Days per week: Not on file    Minutes per session: Not on file  . Stress: Not on file  Relationships  . Social Herbalist on phone: Not on file    Gets together: Not on file    Attends religious service: Not on file    Active member of club or organization: Not on file    Attends meetings of clubs or organizations: Not on file    Relationship status: Not  on file  Other Topics Concern  . Not on file  Social History Narrative  . Not on file   Allergies  Allergen Reactions  . Shellfish Allergy Nausea And Vomiting and Rash   No family history on file.    Current Outpatient Medications (Respiratory):  .  brompheniramine-pseudoephedrine-DM 30-2-10 MG/5ML syrup, Take 5 mLs by mouth 4 (four) times daily as needed. .  fluticasone (FLONASE) 50 MCG/ACT nasal spray, Place 2 sprays into both nostrils daily. .  sodium chloride (OCEAN) 0.65 % SOLN nasal spray, Place 1 spray into both nostrils as needed.       Past medical history, social, surgical and family history all reviewed in electronic medical record.  No pertanent information unless stated regarding to the chief complaint.   Review of Systems:  No headache, visual changes, nausea, vomiting, diarrhea, constipation, dizziness, abdominal pain, skin rash, fevers, chills, night sweats, weight loss, swollen lymph nodes, body aches, joint swelling, muscle aches, chest pain, shortness of breath, mood changes.   Objective  Blood pressure 112/88, pulse 76, height 5\' 7"  (1.702 m), weight 182 lb (82.6 kg), SpO2 98 %.    General: No apparent distress alert and  oriented x3 mood and affect normal, dressed appropriately.  HEENT: Pupils equal, extraocular movements intact  Respiratory: Patient's speak in full sentences and does not appear short of breath  Cardiovascular: No lower extremity edema, non tender, no erythema  Skin: Warm dry intact with no signs of infection or rash on extremities or on axial skeleton.  Abdomen: Soft nontender  Neuro: Cranial nerves II through XII are intact, neurovascularly intact in all extremities with 2+ DTRs and 2+ pulses.  Lymph: No lymphadenopathy of posterior or anterior cervical chain or axillae bilaterally.  Gait normal with good balance and coordination.  MSK:  Non tender with full range of motion and good stability and symmetric strength and tone of  shoulders, elbows, wrist, hip, knee and ankles bilaterally.  Shoulder: Right Inspection reveals no abnormalities, atrophy or asymmetry. Palpation is normal with no tenderness over AC joint or bicipital groove. ROM is full in all planes passively. Rotator cuff strength normal throughout. signs of impingement with positive Neer and Hawkin's tests, but negative empty can sign. Speeds and Yergason's tests normal. No labral pathology noted with negative Obrien's, negative clunk and good stability. Normal scapular function observed. No painful arc and no drop arm sign. No apprehension sign  MSK US performed of: Right This study was ordered, performed, and interpreted by Charlann Boxer D.O.  Shoulder:   Supraspinatus:  Appears normal on long and transverse views, Bursal bulge seen with shoulder abduction on impingement view. Infraspinatus:  Appears normal on long and transverse views. Significant increase in Doppler flow Subscapularis:  Appears normal on long and transverse views. Positive bursa mild chronic irregularity at the insertion of the subscapularis noted as above AC joint:  Capsule undistended, no geyser sign. Glenohumeral Joint:  Appears normal without effusion. Glenoid Labrum:  Intact without visualized tears. Biceps Tendon:  Appears normal on long and transverse views, no fraying of tendon, tendon located in intertubercular groove, no subluxation with shoulder internal or external rotation.  Impression: Subacromial bursitis mild cortical irregularity near the subscapularis insertion  [id number]; 15 additional minutes spent for Therapeutic exercises as stated in above notes.  This included exercises focusing on stretching, strengthening, with significant focus on eccentric aspects.   Long term goals include an improvement in range of motion, strength, endurance as well as avoiding reinjury. Patient's frequency would include in 1-2 times a day, 3-5 times a week for a duration of 6-12 weeks.  Shoulder Exercises that included:  Basic scapular stabilization to include adduction and depression of scapula Scaption, focusing on proper movement and good control Internal and External rotation utilizing a theraband, with elbow tucked at side entire time Rows with theraband which was given    Proper technique shown and discussed handout in great detail with ATC.  All questions were discussed and answered.     Impression and Recommendations:     This case required medical decision making of moderate complexity. The above documentation has been reviewed and is accurate and complete Lyndal Pulley, DO       Note: This dictation was prepared with Dragon dictation along with smaller phrase technology. Any transcriptional errors that result from this process are unintentional.

## 2019-05-14 ENCOUNTER — Other Ambulatory Visit: Payer: Self-pay

## 2019-05-14 ENCOUNTER — Ambulatory Visit (INDEPENDENT_AMBULATORY_CARE_PROVIDER_SITE_OTHER): Payer: 59 | Admitting: Family Medicine

## 2019-05-14 ENCOUNTER — Ambulatory Visit: Payer: Self-pay

## 2019-05-14 ENCOUNTER — Encounter: Payer: Self-pay | Admitting: Family Medicine

## 2019-05-14 VITALS — BP 112/88 | HR 76 | Ht 67.0 in | Wt 182.0 lb

## 2019-05-14 DIAGNOSIS — M25511 Pain in right shoulder: Secondary | ICD-10-CM

## 2019-05-14 DIAGNOSIS — M7551 Bursitis of right shoulder: Secondary | ICD-10-CM | POA: Insufficient documentation

## 2019-05-14 NOTE — Patient Instructions (Addendum)
Good to see you.  Ice 20 minutes 2 times daily. Usually after activity and before bed. Exercises 3 times a week.  Keep hands in peripheral vision Vitamin D 2000 IU daily  See me again in 6 weeks

## 2019-05-14 NOTE — Assessment & Plan Note (Signed)
Chronic shoulder bursitis with some possible irregularity of the subscapularis insertion.  Concern for the possibility also of multidirectional instability.  Patient is going to try some home exercises, icing regimen, which activities to do which wants to avoid.  Patient is to increase activity slowly over the course the next several days.  Discussed topical anti-inflammatories, follow-up again in 4 to 8 weeks

## 2019-06-23 ENCOUNTER — Ambulatory Visit (INDEPENDENT_AMBULATORY_CARE_PROVIDER_SITE_OTHER): Payer: 59 | Admitting: Family Medicine

## 2019-06-23 ENCOUNTER — Other Ambulatory Visit: Payer: Self-pay

## 2019-06-23 ENCOUNTER — Encounter: Payer: Self-pay | Admitting: Family Medicine

## 2019-06-23 DIAGNOSIS — M999 Biomechanical lesion, unspecified: Secondary | ICD-10-CM | POA: Diagnosis not present

## 2019-06-23 DIAGNOSIS — M7551 Bursitis of right shoulder: Secondary | ICD-10-CM | POA: Diagnosis not present

## 2019-06-23 NOTE — Patient Instructions (Signed)
  Happy [place] Day Exercises 3x a week See me again in 6-8 weeks for OMT

## 2019-06-23 NOTE — Assessment & Plan Note (Signed)
Significant improvement with conservative therapy.  Attempted osteopathic manipulation, discussed which activities to do which wants to avoid.  Patient is to increase activity as tolerated.  Follow-up again in 4 to 8 weeks

## 2019-06-23 NOTE — Progress Notes (Signed)
[name] [address] [place], [place] [address] Phone: [phone] Subjective:   Melissa [name], am serving as a scribe for Dr. Hulan Saas.    CC: Right shoulder pain  [id number]   05/14/2019 Chronic shoulder bursitis with some possible irregularity of the subscapularis insertion.  Concern for the possibility also of multidirectional instability.  Patient is going to try some home exercises, icing regimen, which activities to do which wants to avoid.  Patient is to increase activity slowly over the course the next several days.  Discussed topical anti-inflammatories, follow-up again in 4 to 8 weeks  Update 06/23/2019 Melissa [name] is a 48 y.o. female coming in with complaint of right shoulder pain. Patient states that her pain has improved. Patient recalls a fall one year ago and states that this may have caused her shoulder pain. Uses Vitamin D 6000IU daily.   Patient would state that she feels approximately 90% better.  History reviewed. [name] pertinent past medical history. Past Surgical History:  Procedure Laterality Date  . CESAREAN SECTION     2 times   Social History   Socioeconomic History  . Marital status: Married    Spouse name: Not on file  . Number of children: Not on file  . Years of education: Not on file  . Highest education level: Not on file  Occupational History  . Not on file  Social Needs  . Financial resource strain: Not on file  . Food insecurity    Worry: Not on file    Inability: Not on file  . Transportation needs    Medical: Not on file    Non-medical: Not on file  Tobacco Use  . Smoking status: Never Smoker  . Smokeless tobacco: Never Used  Substance and Sexual Activity  . Alcohol use: Not on file  . Drug use: Not on file  . Sexual activity: Not on file  Lifestyle  . Physical activity    Days per week: Not on file    Minutes per session: Not on file  . Stress: Not on file  Relationships  .  Social Herbalist on phone: Not on file    Gets together: Not on file    Attends religious service: Not on file    Active member of club or organization: Not on file    Attends meetings of clubs or organizations: Not on file    Relationship status: Not on file  Other Topics Concern  . Not on file  Social History Narrative  . Not on file   Allergies  Allergen Reactions  . Shellfish Allergy Nausea And Vomiting and Rash   History reviewed. [name] pertinent family history.    Current Outpatient Medications (Respiratory):  .  brompheniramine-pseudoephedrine-DM 30-2-10 MG/5ML syrup, Take 5 mLs by mouth 4 (four) times daily as needed. .  fluticasone (FLONASE) 50 MCG/ACT nasal spray, Place 2 sprays into both nostrils daily. .  sodium chloride (OCEAN) 0.65 % SOLN nasal spray, Place 1 spray into both nostrils as needed.       Past medical history, social, surgical and family history all reviewed in electronic medical record.  [name] pertanent information unless stated regarding to the chief complaint.   Review of Systems:  [name] headache, visual changes, nausea, vomiting, diarrhea, constipation, dizziness, abdominal pain, skin rash, fevers, chills, night sweats, weight loss, swollen lymph nodes, body aches, joint swelling,  chest pain, shortness of breath, mood changes.  Mild positive  muscle aches  Objective  Blood pressure 118/88, pulse 78, height 5\' 7"  (1.702 m), weight 182 lb (82.6 kg), SpO2 99 %.    General: [name] apparent distress alert and oriented x3 mood and affect normal, dressed appropriately.  HEENT: Pupils equal, extraocular movements intact  Respiratory: Patient's speak in full sentences and does not appear short of breath  Cardiovascular: [name] lower extremity edema, non tender, [name] erythema  Skin: Warm dry intact with [name] signs of infection or rash on extremities or on axial skeleton.  Abdomen: Soft nontender  Neuro: Cranial nerves II through XII are intact, neurovascularly  intact in all extremities with 2+ DTRs and 2+ pulses.  Lymph: [name] lymphadenopathy of posterior or anterior cervical chain or axillae bilaterally.  Gait normal with good balance and coordination.  MSK:  Non tender with full range of motion and good stability and symmetric strength and tone of  elbows, wrist, hip, knee and ankles bilaterally.  Shoulder: Right Inspection reveals [name] abnormalities, atrophy or asymmetry. Palpation is normal with [name] tenderness over AC joint or bicipital groove. ROM is full in all planes. Rotator cuff strength normal throughout. [name] signs of impingement with negative Neer and Hawkin's tests, empty can sign. Speeds and Yergason's tests normal. [name] labral pathology noted with negative Obrien's, negative clunk and good stability. Normal scapular function observed. [name] painful arc and [name] drop arm sign. [name] apprehension sign Contralateral shoulder unremarkable  Tightness noted in the parascapular region of the shoulders bilaterally.  Possible slight bradycardia noted.  Osteopathic findings  C2 flexed rotated and side bent right C4 flexed rotated and side bent left C6 flexed rotated and side bent left T3 extended rotated and side bent right inhaled third rib T7 extended rotated and side bent left      Impression and Recommendations:     This case required medical decision making of moderate complexity. The above documentation has been reviewed and is accurate and complete Lyndal Pulley, DO       Note: This dictation was prepared with Dragon dictation along with smaller phrase technology. Any transcriptional errors that result from this process are unintentional.

## 2019-06-23 NOTE — Assessment & Plan Note (Signed)
Decision today to treat with OMT was based on Physical Exam  After verbal consent patient was treated with HVLA, ME, FPR techniques in cervical, thoracic, rib areas  Patient tolerated the procedure well with improvement in symptoms  Patient given exercises, stretches and lifestyle modifications  See medications in patient instructions if given  Patient will follow up in 4-8 weeks 

## 2019-08-02 ENCOUNTER — Ambulatory Visit (INDEPENDENT_AMBULATORY_CARE_PROVIDER_SITE_OTHER): Payer: 59 | Admitting: Family Medicine

## 2019-08-02 ENCOUNTER — Other Ambulatory Visit: Payer: Self-pay

## 2019-08-02 ENCOUNTER — Encounter: Payer: Self-pay | Admitting: Family Medicine

## 2019-08-02 VITALS — BP 110/82 | HR 89 | Ht 67.0 in | Wt 189.0 lb

## 2019-08-02 DIAGNOSIS — M7551 Bursitis of right shoulder: Secondary | ICD-10-CM | POA: Diagnosis not present

## 2019-08-02 DIAGNOSIS — M999 Biomechanical lesion, unspecified: Secondary | ICD-10-CM | POA: Diagnosis not present

## 2019-08-02 NOTE — Assessment & Plan Note (Signed)
Decision today to treat with OMT was based on Physical Exam  After verbal consent patient was treated with HVLA, ME, FPR techniques in cervical, thoracic, rib areas  Patient tolerated the procedure well with improvement in symptoms  Patient given exercises, stretches and lifestyle modifications  See medications in patient instructions if given  Patient will follow up in 4-8 weeks 

## 2019-08-02 NOTE — Patient Instructions (Addendum)
Injected shoulder today Keep doing exercises See me again in 6-8 weeks, if not better we will [place]

## 2019-08-02 NOTE — Assessment & Plan Note (Signed)
Patient given injection today, tolerated the procedure well, discussed icing regimen and home exercise, discussed which activities to do which was to avoid.  Increase activity slowly.  Follow-up again in 4 to 8 weeks.

## 2019-08-02 NOTE — Progress Notes (Signed)
[name] [place], [place] [address] Phone: [phone] Subjective:   Melissa [name], am serving as a scribe for Dr. Hulan Saas. This visit occurred during the SARS-CoV-2 public health emergency.  Safety protocols were in place, including screening questions prior to the visit, additional usage of staff PPE, and extensive cleaning of exam room while observing appropriate contact time as indicated for disinfecting solutions.   I'm seeing this patient by the request  of:    CC: Right shoulder pain follow-up, neck pain follow-up  [id number]   06/23/2019 Significant improvement with conservative therapy.  Attempted osteopathic manipulation, discussed which activities to do which wants to avoid.  Patient is to increase activity as tolerated.  Follow-up again in 4 to 8 weeks  Update 08/02/2019 Melissa [name] is a 48 y.o. female coming in with complaint of right shoulder and back pain. Patient states that she did have a few times over the holiday where she had extreme pain. Patient said that adjustments did help with back and shoulder but now shoulder is "slipping." Pain feels similar to when she initially saw [place]. Pain increases with extension.      [name] past medical history on file. Past Surgical History:  Procedure Laterality Date  . CESAREAN SECTION     2 times   Social History   Socioeconomic History  . Marital status: Married    Spouse name: Not on file  . Number of children: Not on file  . Years of education: Not on file  . Highest education level: Not on file  Occupational History  . Not on file  Tobacco Use  . Smoking status: Never Smoker  . Smokeless tobacco: Never Used  Substance and Sexual Activity  . Alcohol use: Not on file  . Drug use: Not on file  . Sexual activity: Not on file  Other Topics Concern  . Not on file  Social History Narrative  . Not on file   Social Determinants of Health   Financial  Resource Strain:   . Difficulty of Paying Living Expenses: Not on file  Food Insecurity:   . Worried About Charity fundraiser in the Last Year: Not on file  . Ran Out of Food in the Last Year: Not on file  Transportation Needs:   . Lack of Transportation (Medical): Not on file  . Lack of Transportation (Non-Medical): Not on file  Physical Activity:   . Days of Exercise per Week: Not on file  . Minutes of Exercise per Session: Not on file  Stress:   . Feeling of Stress : Not on file  Social Connections:   . Frequency of Communication with Friends and Family: Not on file  . Frequency of Social Gatherings with Friends and Family: Not on file  . Attends Religious Services: Not on file  . Active Member of Clubs or Organizations: Not on file  . Attends Archivist Meetings: Not on file  . Marital Status: Not on file   Allergies  Allergen Reactions  . Shellfish Allergy Nausea And Vomiting and Rash   [name] family history on file.  [name] family history of autoimmune    Current Outpatient Medications (Respiratory):  .  brompheniramine-pseudoephedrine-DM 30-2-10 MG/5ML syrup, Take 5 mLs by mouth 4 (four) times daily as needed. .  fluticasone (FLONASE) 50 MCG/ACT nasal spray, Place 2 sprays into both nostrils daily. .  sodium chloride (OCEAN) 0.65 % SOLN nasal spray, Place 1 spray  into both nostrils as needed.       Past medical history, social, surgical and family history all reviewed in electronic medical record.  [name] pertanent information unless stated regarding to the chief complaint.   Review of Systems:  [name] headache, visual changes, nausea, vomiting, diarrhea, constipation, dizziness, abdominal pain, skin rash, fevers, chills, night sweats, weight loss, swollen lymph nodes, body aches, joint swelling,  chest pain, shortness of breath, mood changes.  Positive muscle aches  Objective  Blood pressure 110/82, pulse 89, height 5\' 7"  (1.702 m), weight 189 lb (85.7 kg), SpO2 98  %.    General: [name] apparent distress alert and oriented x3 mood and affect normal, dressed appropriately.  HEENT: Pupils equal, extraocular movements intact  Respiratory: Patient's speak in full sentences and does not appear short of breath  Cardiovascular: [name] lower extremity edema, non tender, [name] erythema  Skin: Warm dry intact with [name] signs of infection or rash on extremities or on axial skeleton.  Abdomen: Soft nontender  Neuro: Cranial nerves II through XII are intact, neurovascularly intact in all extremities with 2+ DTRs and 2+ pulses.  Lymph: [name] lymphadenopathy of posterior or anterior cervical chain or axillae bilaterally.  Gait normal with good balance and coordination.  MSK:  tender with full range of motion and good stability and symmetric strength and tone of elbows, wrist, hip, knee and ankles bilaterally.  Right shoulder exam shows the patient has [name] significant abnormality on inspection.  Patient noted does have positive impingement with Neer's and Hawkins.  Mild positive crossover. Tightness in the parascapular region right greater than left.  Mild scapular dyskinesis noted as well  Osteopathic findings C2 flexed rotated and side bent right T3 extended rotated and side bent right inhaled third rib T9 extended rotated and side bent left  After informed written and verbal consent, patient was seated on exam table. Right shoulder was prepped with alcohol swab and utilizing posterior approach, patient's right glenohumeral space was injected with 4:1  marcaine 0.5%: Kenalog 40mg /dL. Patient tolerated the procedure well without immediate complications.   Impression and Recommendations:     This case required medical decision making of moderate complexity. The above documentation has been reviewed and is accurate and complete Lyndal Pulley, DO       Note: This dictation was prepared with Dragon dictation along with smaller phrase technology. Any transcriptional errors that  result from this process are unintentional.

## 2019-09-13 ENCOUNTER — Encounter: Payer: Self-pay | Admitting: Family Medicine

## 2019-09-13 ENCOUNTER — Ambulatory Visit (INDEPENDENT_AMBULATORY_CARE_PROVIDER_SITE_OTHER): Payer: 59 | Admitting: Family Medicine

## 2019-09-13 ENCOUNTER — Ambulatory Visit (INDEPENDENT_AMBULATORY_CARE_PROVIDER_SITE_OTHER): Payer: 59

## 2019-09-13 ENCOUNTER — Other Ambulatory Visit: Payer: Self-pay

## 2019-09-13 VITALS — BP 112/88 | HR 92 | Ht 67.0 in | Wt 189.0 lb

## 2019-09-13 DIAGNOSIS — M7551 Bursitis of right shoulder: Secondary | ICD-10-CM

## 2019-09-13 DIAGNOSIS — M25511 Pain in right shoulder: Secondary | ICD-10-CM

## 2019-09-13 DIAGNOSIS — G8929 Other chronic pain: Secondary | ICD-10-CM

## 2019-09-13 DIAGNOSIS — M999 Biomechanical lesion, unspecified: Secondary | ICD-10-CM

## 2019-09-13 NOTE — Assessment & Plan Note (Signed)
On ultrasound today no significant inflammation noted.  Patient does have some very mild chronic changes of rotator cuff but no acute tear appreciated.  Patient did have good response with formal physical therapy previously and we can have patient go if necessary.  Discussed icing regimen and home exercise, follow-up again in 4 to 8 weeks

## 2019-09-13 NOTE — Assessment & Plan Note (Addendum)
Patient had injections today, tolerated the procedure well, discussed icing regimen and home exercises, we discussed that I do think some of this is ergonomics and we discussed trying the standing desk on a more regular basis.  No change in medications.  Follow-up again in 4 to 8 weeks patient was injected in the rhomboid, trapezius and levator

## 2019-09-13 NOTE — Progress Notes (Signed)
[facility] [place] Phone: [phone] Subjective:   Fontaine No, am serving as a scribe for Dr. Hulan Saas. This visit occurred during the SARS-CoV-2 public health emergency.  Safety protocols were in place, including screening questions prior to the visit, additional usage of staff PPE, and extensive cleaning of exam room while observing appropriate contact time as indicated for disinfecting solutions.   I'm seeing this patient by the request  of:  Patient, No Pcp Per  CC: Right shoulder pain, upper back pain follow-up  [id number]   08/02/2019 Patient given injection today, tolerated the procedure well, discussed icing regimen and home exercise, discussed which activities to do which was to avoid.  Increase activity slowly.  Follow-up again in 4 to 8 weeks.  Update 09/13/2019 Melissa [name] is a 49 y.o. female coming in with complaint of back pain and right shoulder bursitis. Patient states that her pain is less but still has instances where she feels her shoulder pops out of joint. Pain occurring in middle deltoid down to deltoid insertion.  Patient states that initially after the injection was feeling approximately 85% better and then unfortunately pain started worsening again.  States that it seems to be maybe a little bit more posterior aspect of the shoulder than it was previously.  Still waking her up at night to a certain degree.  Notices worse when she is at a computer for long amount of time.  Back pain has improved. Is not having pain today. Is here for OMT.      No past medical history on file. Past Surgical History:  Procedure Laterality Date  . CESAREAN SECTION     2 times   Social History   Socioeconomic History  . Marital status: Married    Spouse name: Not on file  . Number of children: Not on file  . Years of education: Not on file  . Highest education level: Not on file  Occupational  History  . Not on file  Tobacco Use  . Smoking status: Never Smoker  . Smokeless tobacco: Never Used  Substance and Sexual Activity  . Alcohol use: Not on file  . Drug use: Not on file  . Sexual activity: Not on file  Other Topics Concern  . Not on file  Social History Narrative  . Not on file   Social Determinants of Health   Financial Resource Strain:   . Difficulty of Paying Living Expenses: Not on file  Food Insecurity:   . Worried About Charity fundraiser in the Last Year: Not on file  . Ran Out of Food in the Last Year: Not on file  Transportation Needs:   . Lack of Transportation (Medical): Not on file  . Lack of Transportation (Non-Medical): Not on file  Physical Activity:   . Days of Exercise per Week: Not on file  . Minutes of Exercise per Session: Not on file  Stress:   . Feeling of Stress : Not on file  Social Connections:   . Frequency of Communication with Friends and Family: Not on file  . Frequency of Social Gatherings with Friends and Family: Not on file  . Attends Religious Services: Not on file  . Active Member of Clubs or Organizations: Not on file  . Attends Archivist Meetings: Not on file  . Marital Status: Not on file   Allergies  Allergen Reactions  . Shellfish Allergy Nausea And Vomiting  and Rash   No family history on file.    Current Outpatient Medications (Respiratory):  .  brompheniramine-pseudoephedrine-DM 30-2-10 MG/5ML syrup, Take 5 mLs by mouth 4 (four) times daily as needed. .  fluticasone (FLONASE) 50 MCG/ACT nasal spray, Place 2 sprays into both nostrils daily. .  sodium chloride (OCEAN) 0.65 % SOLN nasal spray, Place 1 spray into both nostrils as needed.      Reviewed prior external information including notes and imaging from  primary care provider As well as notes that were available from care everywhere and other healthcare systems.  Past medical history, social, surgical and family history all reviewed in  electronic medical record.  No pertanent information unless stated regarding to the chief complaint.   Review of Systems:  No headache, visual changes, nausea, vomiting, diarrhea, constipation, dizziness, abdominal pain, skin rash, fevers, chills, night sweats, weight loss, swollen lymph nodes, body aches, joint swelling, chest pain, shortness of breath, mood changes. POSITIVE muscle aches  Objective  Blood pressure 112/88, pulse 92, height 5\' 7"  (1.702 m), weight 189 lb (85.7 kg), SpO2 99 %.   General: No apparent distress alert and oriented x3 mood and affect normal, dressed appropriately.  HEENT: Pupils equal, extraocular movements intact  Respiratory: Patient's speak in full sentences and does not appear short of breath  Cardiovascular: No lower extremity edema, non tender, no erythema  Skin: Warm dry intact with no signs of infection or rash on extremities or on axial skeleton.  Abdomen: Soft nontender  Neuro: Cranial nerves II through XII are intact, neurovascularly intact in all extremities with 2+ DTRs and 2+ pulses.  Lymph: No lymphadenopathy of posterior or anterior cervical chain or axillae bilaterally.  Gait normal with good balance and coordination.  MSK:  Non tender with full range of motion and good stability and symmetric strength and tone of  elbows, wrist, hip, knee and ankles bilaterally.  Right shoulder exam shows the patient does have positive impingement still remaining.  Patient has some mild tightness with crossover.  Significant trigger points noted in the levator scapula, rhomboid and trapezius on the right shoulder.  Neck exam mild loss of 5 degrees of extension, with tightness in the parascapular region.  Limited musculoskeletal ultrasound was performed and interpreted by Lyndal Pulley  Limited ultrasound of patient's shoulder shows some very mild bursitis noted.  Otherwise fairly unremarkable.  After verbal consent patient was prepped with alcohol swabs and with  a 25-gauge half inch needle injected into the 3 distinct trigger points 1 in the rhomboid, 1 in the trapezius and 1 in the levator scapula of the right shoulder region.  A total of 3 cc of 0.5% Marcaine and 1 cc of Kenalog 40 mg/mL used  Osteopathic findings C4 flexed rotated and side bent left C6 flexed rotated and side bent left T3 extended rotated and side bent right inhaled third rib     Impression and Recommendations:     This case required medical decision making of moderate complexity. The above documentation has been reviewed and is accurate and complete Lyndal Pulley, DO       Note: This dictation was prepared with Dragon dictation along with smaller phrase technology. Any transcriptional errors that result from this process are unintentional.

## 2019-09-13 NOTE — Patient Instructions (Addendum)
Good to see you PT [facility] it is up to you Try the standing desk more See me again in 4-5 weeks

## 2019-09-13 NOTE — Assessment & Plan Note (Signed)
Decision today to treat with OMT was based on Physical Exam  After verbal consent patient was treated with HVLA, ME, FPR techniques in cervical, thoracic, rib areas  Patient tolerated the procedure well with improvement in symptoms  Patient given exercises, stretches and lifestyle modifications  See medications in patient instructions if given  Patient will follow up in 4-8 weeks 

## 2019-09-21 ENCOUNTER — Ambulatory Visit: Payer: 59

## 2019-09-22 ENCOUNTER — Other Ambulatory Visit: Payer: Self-pay

## 2019-09-22 ENCOUNTER — Ambulatory Visit: Payer: 59 | Attending: Family Medicine | Admitting: Physical Therapy

## 2019-09-22 ENCOUNTER — Encounter: Payer: Self-pay | Admitting: Physical Therapy

## 2019-09-22 DIAGNOSIS — M25511 Pain in right shoulder: Secondary | ICD-10-CM | POA: Insufficient documentation

## 2019-09-22 DIAGNOSIS — M62838 Other muscle spasm: Secondary | ICD-10-CM | POA: Insufficient documentation

## 2019-09-22 DIAGNOSIS — M6281 Muscle weakness (generalized): Secondary | ICD-10-CM | POA: Diagnosis not present

## 2019-09-22 DIAGNOSIS — G8929 Other chronic pain: Secondary | ICD-10-CM | POA: Insufficient documentation

## 2019-09-22 NOTE — Patient Instructions (Addendum)
Trigger Point Dry Needling  . What is Trigger Point Dry Needling (DN)? o DN is a physical therapy technique used to treat muscle pain and dysfunction. Specifically, DN helps deactivate muscle trigger points (muscle knots).  o A thin filiform needle is used to penetrate the skin and stimulate the underlying trigger point. The goal is for a local twitch response (LTR) to occur and for the trigger point to relax. No medication of any kind is injected during the procedure.   . What Does Trigger Point Dry Needling Feel Like?  o The procedure feels different for each individual patient. Some patients report that they do not actually feel the needle enter the skin and overall the process is not painful. Very mild bleeding may occur. However, many patients feel a deep cramping in the muscle in which the needle was inserted. This is the local twitch response.   Marland Kitchen How Will I feel after the treatment? o Soreness is normal, and the onset of soreness may not occur for a few hours. Typically this soreness does not last longer than two days.  o Bruising is uncommon, however; ice can be used to decrease any possible bruising.  o In rare cases feeling tired or nauseous after the treatment is normal. In addition, your symptoms may get worse before they get better, this period will typically not last longer than 24 hours.   . What Can I do After My Treatment? o Increase your hydration by drinking more water for the next 24 hours. o You may place ice or heat on the areas treated that have become sore, however, do not use heat on inflamed or bruised areas. Heat often brings more relief post needling. o You can continue your regular activities, but vigorous activity is not recommended initially after the treatment for 24 hours. o DN is best combined with other physical therapy such as strengthening, stretching, and other therapies.    Access Code: [facility]  URL: [email or web address]  Date: 09/22/2019   Prepared by: Sherol Dade    Exercises Seated Shoulder External Rotation with Resistance- 10 reps- 3 sets- 1x daily- 7x weekly  Squatting Shoulder Row with Anchored Resistance- 15 reps- 3 sets- 1x daily- 7x weekly   [place] [address] [place], [place] [address] Phone # [phone] Fax [phone]

## 2019-09-22 NOTE — Therapy (Signed)
[facility] [address] [place], [place], [address] Phone: [phone]   Fax:  [phone]  Physical Therapy Evaluation  Patient Details  Name: Melissa [name] MRN: [id number] Date of Birth: 1970-10-21 Referring Provider (PT): Hulan Saas, DO   Encounter Date: 09/22/2019  PT End of Session - 09/22/19 1041    Visit Number  1    Date for PT Re-Evaluation  11/03/19    Authorization Type  [employer]    Authorization Time Period  09/22/19 to 11/03/19    PT Start Time  0846    PT Stop Time  0928    PT Time Calculation (min)  42 min    Activity Tolerance  No increased pain;Patient tolerated treatment well    Behavior During Therapy  [facility] for tasks assessed/performed       History reviewed. No pertinent past medical history.  Past Surgical History:  Procedure Laterality Date  . CESAREAN SECTION     2 times    There were no vitals filed for this visit.   Subjective Assessment - 09/22/19 0848    Subjective  Pt states that she has been to PT 2-3 years or so ago for her Rt shoulder. It was doing well, but sometime the beginning of last year, her pain was more intense with using the Rt UE. She feels a popping and pain sometimes when reaching out to the side. She denies numbness/tingling.    Diagnostic tests  [place]: rotator cuff intact    Patient Stated Goals  decrease popping and pain    Currently in Pain?  No/denies   sharp pain Rt posterior/lateral shoulder sometimes when reaching out to side        [facility] PT Assessment - 09/22/19 0001      Assessment   Medical Diagnosis  chronic Rt shoulder pain     Referring Provider (PT)  Hulan Saas, DO    Onset Date/Surgical Date  --   summer of 2020   Hand Dominance  Right    Next MD Visit  10/11/19    Prior Therapy  3 years ago for Rt shoulder       Precautions   Precautions  None      Balance Screen   Has the patient fallen in the past 6 months  No     Has the patient had a decrease in activity level because of a fear of falling?   No    Is the patient reluctant to leave their home because of a fear of falling?   No      Prior Function   Vocation  Full time employment      Cognition   Overall Cognitive Status  Within Functional Limits for tasks assessed      Observation/Other Assessments   Focus on Therapeutic Outcomes (FOTO)   30% limited       Sensation   Additional Comments  denies numbness/tingling       Posture/Postural Control   Posture Comments  rounded shoulders      ROM / Strength   AROM / PROM / Strength  AROM;Strength      AROM   Overall AROM Comments  active ROM WNL except reach behind back: Rt to level of inferior border of scapula (+) pain       Strength   Overall Strength Comments  all 5/5 MMT except Rt shoulder ER in 0/90 deg abd: 3+/5, scapular strength 3+/5  Palpation   Palpation comment  tenderness with palpation of Rt infraspinatus, Rt teres minor                Objective measurements completed on examination: See above findings.      [place] Adult PT Treatment/Exercise - 09/22/19 0001      Exercises   Exercises  Shoulder      Shoulder Exercises: Standing   External Rotation  Right;Strengthening;10 reps    Theraband Level (Shoulder External Rotation)  Level 2 (Red)    External Rotation Limitations  PT cuing to stabilize scapula    Row  Strengthening;Both;15 reps    Theraband Level (Shoulder Row)  Level 4 (Blue)      Shoulder Exercises: Stretch   Other Shoulder Stretches  self foam rolling Rt posterior shoulder       Manual Therapy   Manual therapy comments  STM/trigger point releasde Rt infraspinatus             PT Education - 09/22/19 1040    Education Details  eval findings/POC; implemented and reviewed HEP    Person(s) Educated  Patient    Methods  Explanation;Handout;Verbal cues    Comprehension  Verbalized understanding;Returned demonstration       PT Short  Term Goals - 09/22/19 1051      PT SHORT TERM GOAL #1   Title  Pt will demo consistency and independence with her initial HEP to improve shoulder strength.    Time  3    Period  Weeks    Status  New    Target Date  10/13/19        PT Long Term Goals - 09/22/19 1052      PT LONG TERM GOAL #1   Title  Pt will have 5/5 MMT of the Rt shoulder to improve her stability with daily activity and return to tennis.    Time  6    Period  Weeks    Status  New    Target Date  11/03/19      PT LONG TERM GOAL #2   Title  Pt will be able to reach behind her back with Rt UE to beyond the inferior angle of the scapula without pain.    Time  6    Period  Weeks    Status  New      PT LONG TERM GOAL #3   Title  Pt will report atleast 60% improvement in her Rt shoulder pain and popping throughout the day.    Time  6    Period  Weeks    Status  New      PT LONG TERM GOAL #4   Title  Pt will be able to complete tennis serve modulation at 75% effort x10 repetitions without increase in Rt shoulder pain/popping.    Time  6    Period  Weeks    Status  New             Plan - 09/22/19 1014    Clinical Impression Statement  Pt is a 49 y.o F referred to OPPT with complaints of Rt shoulder pain onset following a tennis serve last summer. Pt's pain is worse with reaching behind her and end ranges of external rotation/horizontal abduction. Pt notes occasional popping in the shoulder with sharp pain that resolves quickly during these movements. Pt's active ROM is within normal limits, except for reach behind her back which is painful and limited compared to the Lt.  She has painful shoulder flexion, and weakness of the posterior shoulder/scapula. Pt has responded well to PT for this in the past and she would benefit from skilled PT for the next several weeks to address shoulder ROM restrictions and progress shoulder strength/neuromuscular control for return to pain free daily activity and participation in  recreation.    Personal Factors and Comorbidities  Age;Time since onset of injury/illness/exacerbation;Fitness    Examination-Activity Limitations  Other    Examination-Participation Restrictions  Other    Stability/Clinical Decision Making  Stable/Uncomplicated    Clinical Decision Making  Low    Rehab Potential  Excellent    PT Frequency  2x / week    PT Duration  6 weeks    PT Treatment/Interventions  ADLs/Self Care Home Management;Cryotherapy;Iontophoresis 4mg /ml Dexamethasone;Electrical Stimulation;Moist Heat;Neuromuscular re-education;Therapeutic exercise;Therapeutic activities;Patient/family education;Dry needling;Manual techniques;Passive range of motion;Taping    PT Next Visit Plan  DN Rt posterior shoulder/deltoid; posterior shoulder and scap strengthening    PT Home Exercise Plan  [facility]    Consulted and Agree with Plan of Care  Patient       Patient will benefit from skilled therapeutic intervention in order to improve the following deficits and impairments:  Impaired flexibility, Decreased strength, Decreased range of motion, Increased muscle spasms, Pain  Visit Diagnosis: Chronic right shoulder pain  Muscle weakness (generalized)  Other muscle spasm     Problem List Patient Active Problem List   Diagnosis Date Noted  . Trigger point of right shoulder region 09/13/2019  . Nonallopathic lesion of thoracic region 06/23/2019  . Nonallopathic lesion of cervical region 06/23/2019  . Nonallopathic lesion of rib cage 06/23/2019  . Chronic shoulder bursitis, right 05/14/2019    10:56 AM,09/22/19 Sherol Dade PT, DPT [facility] [address] [place], [place], [address] Phone: [phone]   Fax:  [phone]  Name: Melissa [name] MRN: [id number] Date of Birth: [birth year]

## 2019-09-24 ENCOUNTER — Ambulatory Visit: Payer: 59 | Admitting: Physical Therapy

## 2019-09-24 ENCOUNTER — Encounter: Payer: Self-pay | Admitting: Physical Therapy

## 2019-09-24 ENCOUNTER — Other Ambulatory Visit: Payer: Self-pay

## 2019-09-24 DIAGNOSIS — M6281 Muscle weakness (generalized): Secondary | ICD-10-CM

## 2019-09-24 DIAGNOSIS — G8929 Other chronic pain: Secondary | ICD-10-CM

## 2019-09-24 DIAGNOSIS — M62838 Other muscle spasm: Secondary | ICD-10-CM | POA: Diagnosis not present

## 2019-09-24 DIAGNOSIS — M25511 Pain in right shoulder: Secondary | ICD-10-CM | POA: Diagnosis not present

## 2019-09-24 NOTE — Therapy (Signed)
[facility] [address] [place], [place], [address] Phone: [phone]   Fax:  [phone]  Physical Therapy Treatment  Patient Details  Name: Melissa [name] MRN: [id number] Date of Birth: June 06, 1971 Referring Provider (PT): Hulan Saas, DO   Encounter Date: 09/24/2019  PT End of Session - 09/24/19 1136    Visit Number  2    Date for PT Re-Evaluation  11/03/19    Authorization Type  [employer]    Authorization Time Period  09/22/19 to 11/03/19    PT Start Time  0932    PT Stop Time  1019    PT Time Calculation (min)  47 min    Activity Tolerance  No increased pain;Patient tolerated treatment well       History reviewed. No pertinent past medical history.  Past Surgical History:  Procedure Laterality Date  . CESAREAN SECTION     2 times    There were no vitals filed for this visit.  Subjective Assessment - 09/24/19 0934    Subjective  Still popping in shoulder with horizontal abduction.  Tightness  around shoulder blade.    Currently in Pain?  No/denies                       [facility] Adult PT Treatment/Exercise - 09/24/19 0001      Self-Care   Self-Care  Other Self-Care Comments    Other Self-Care Comments   tennis ball and foam roll for soft tissue mobilization       Shoulder Exercises: Seated   Other Seated Exercises  thoracic extension with foam roll 10x       Shoulder Exercises: Prone   Extension  AROM;Right;15 reps    Extension Limitations  with scapular depression and retraction     Horizontal ABduction 1  AROM;Right;15 reps    Horizontal ABduction 1 Limitations  with scapular retraction/depression       Moist Heat Therapy   Number Minutes Moist Heat  5 Minutes    Moist Heat Location  Shoulder      Manual Therapy   Manual Therapy  Soft tissue mobilization    Soft tissue mobilization  right upper trap, levator, rhomboids, subscapularis, lats        Trigger  Point Dry Needling - 09/24/19 0001    Consent Given?  Yes    Muscles Treated Head and Neck  Upper trapezius    Muscles Treated Upper Quadrant  Rhomboids;Supraspinatus;Infraspinatus;Subscapularis;Latissimus dorsi    Other Dry Needling  right only     Upper Trapezius Response  Twitch reponse elicited;Palpable increased muscle length    Rhomboids Response  Palpable increased muscle length    Supraspinatus Response  Palpable increased muscle length    Infraspinatus Response  Palpable increased muscle length    Subscapularis Response  Palpable increased muscle length    Latissimus dorsi Response  Palpable increased muscle length           PT Education - 09/24/19 1011    Education Details  Access Code: [email or web address]  prone Is, Ts; DN after care    Person(s) Educated  Patient    Methods  Explanation;Demonstration;Handout    Comprehension  Returned demonstration;Verbalized understanding       PT Short Term Goals - 09/22/19 1051      PT SHORT TERM GOAL #1   Title  Pt will demo consistency and independence with her initial HEP to improve shoulder strength.  Time  3    Period  Weeks    Status  New    Target Date  10/13/19        PT Long Term Goals - 09/22/19 1052      PT LONG TERM GOAL #1   Title  Pt will have 5/5 MMT of the Rt shoulder to improve her stability with daily activity and return to tennis.    Time  6    Period  Weeks    Status  New    Target Date  11/03/19      PT LONG TERM GOAL #2   Title  Pt will be able to reach behind her back with Rt UE to beyond the inferior angle of the scapula without pain.    Time  6    Period  Weeks    Status  New      PT LONG TERM GOAL #3   Title  Pt will report atleast 60% improvement in her Rt shoulder pain and popping throughout the day.    Time  6    Period  Weeks    Status  New      PT LONG TERM GOAL #4   Title  Pt will be able to complete tennis serve modulation at 75% effort x10 repetitions without increase in Rt shoulder  pain/popping.    Time  6    Period  Weeks    Status  New            Plan - 09/24/19 1001    Clinical Impression Statement  The patient reports compliance with initial HEP and no changes from evaluation.  She has pain with lying prone with arm in neutral extension position.  She has tender points in right upper trap and periscapular muscles.  She is receptive to dry needling and manual therapy to address these taut bands.  Afterwards, she reports no pain with holding her arm in neutral extension as she did initially and much improved soft tissue mobility noted.  Therapist monitoring response and providing verbal and tactile cues for scapular muscle activation with exercises.    Rehab Potential  Excellent    PT Frequency  2x / week    PT Duration  6 weeks    PT Treatment/Interventions  ADLs/Self Care Home Management;Cryotherapy;Iontophoresis 4mg /ml Dexamethasone;Electrical Stimulation;Moist Heat;Neuromuscular re-education;Therapeutic exercise;Therapeutic activities;Patient/family education;Dry needling;Manual techniques;Passive range of motion;Taping    PT Next Visit Plan  assess response to DN #1 ( may add deltoid in the future);  Right glenoumeral and scapular joint mobs;  Right posterior shoulder and scapular strengthening    PT Home Exercise Plan  [facility]       Patient will benefit from skilled therapeutic intervention in order to improve the following deficits and impairments:  Impaired flexibility, Decreased strength, Decreased range of motion, Increased muscle spasms, Pain  Visit Diagnosis: Chronic right shoulder pain  Muscle weakness (generalized)  Other muscle spasm     Problem List Patient Active Problem List   Diagnosis Date Noted  . Trigger point of right shoulder region 09/13/2019  . Nonallopathic lesion of thoracic region 06/23/2019  . Nonallopathic lesion of cervical region 06/23/2019  . Nonallopathic lesion of rib cage 06/23/2019  . Chronic shoulder bursitis,  right 05/14/2019    Alvera Singh 09/24/2019, 11:49 AM  [facility] [address] [place], [place], [address] Phone: [phone]   Fax:  [phone]  Name: Melissa [name] MRN: [id number] Date  of Birth: May 20, 1971

## 2019-09-24 NOTE — Patient Instructions (Signed)
Access Code: [facility]  URL: [email or web address]  Date: 09/24/2019  Prepared by: Ruben Im   Exercises Seated Shoulder External Rotation with Resistance - 10 reps - 3 sets - 1x daily - 7x weekly Squatting Shoulder Row with Anchored Resistance - 15 reps - 3 sets - 1x daily - 7x weekly Prone Shoulder Extension - Single Arm - 10 reps - 2 sets - 1x daily                            - 7x weekly Prone Single Arm Shoulder Horizontal Abduction with Scapular Retraction and Palm Down - 10 reps - 2 sets - 1x daily - 7x weekly                     Trigger Point Dry Needling  . What is Trigger Point Dry Needling (DN)? o DN is a physical therapy technique used to treat muscle pain and dysfunction. Specifically, DN helps deactivate muscle trigger points (muscle knots).  o A thin filiform needle is used to penetrate the skin and stimulate the underlying trigger point. The goal is for a local twitch response (LTR) to occur and for the trigger point to relax. No medication of any kind is injected during the procedure.   . What Does Trigger Point Dry Needling Feel Like?  o The procedure feels different for each individual patient. Some patients report that they do not actually feel the needle enter the skin and overall the process is not painful. Very mild bleeding may occur. However, many patients feel a deep cramping in the muscle in which the needle was inserted. This is the local twitch response.   Marland Kitchen How Will I feel after the treatment? o Soreness is normal, and the onset of soreness may not occur for a few hours. Typically this soreness does not last longer than two days.  o Bruising is uncommon, however; ice can be used to decrease any possible bruising.  o In rare cases feeling tired or nauseous after the treatment is normal. In addition, your symptoms may get worse before they get better, this period will typically not last longer than 24 hours.   . What Can I do After My  Treatment? o Increase your hydration by drinking more water for the next 24 hours. o You may place ice or heat on the areas treated that have become sore, however, do not use heat on inflamed or bruised areas. Heat often brings more relief post needling. o You can continue your regular activities, but vigorous activity is not recommended initially after the treatment for 24 hours. o DN is best combined with other physical therapy such as strengthening, stretching, and other therapies.    Ruben Im PT [facility] [address], [place], [place] [address] Phone # [phone] Fax [phone]

## 2019-09-27 ENCOUNTER — Encounter: Payer: Self-pay | Admitting: Physical Therapy

## 2019-09-27 ENCOUNTER — Ambulatory Visit: Payer: 59 | Admitting: Physical Therapy

## 2019-09-27 ENCOUNTER — Other Ambulatory Visit: Payer: Self-pay

## 2019-09-27 DIAGNOSIS — M62838 Other muscle spasm: Secondary | ICD-10-CM | POA: Diagnosis not present

## 2019-09-27 DIAGNOSIS — M6281 Muscle weakness (generalized): Secondary | ICD-10-CM | POA: Diagnosis not present

## 2019-09-27 DIAGNOSIS — G8929 Other chronic pain: Secondary | ICD-10-CM

## 2019-09-27 DIAGNOSIS — M25511 Pain in right shoulder: Secondary | ICD-10-CM | POA: Diagnosis not present

## 2019-09-27 NOTE — Therapy (Signed)
[facility] [address] [place], [place], [address] Phone: [phone]   Fax:  [phone]  Physical Therapy Treatment  Patient Details  Name: Melissa [name] MRN: [id number] Date of Birth: [birth year] Referring Provider (PT): Hulan Saas, DO   Encounter Date: 09/27/2019  PT End of Session - 09/27/19 1233    Visit Number  3    Date for PT Re-Evaluation  11/03/19    Authorization Type  [employer]    Authorization Time Period  09/22/19 to 11/03/19    PT Start Time  1231    PT Stop Time  1311    PT Time Calculation (min)  40 min    Activity Tolerance  Patient tolerated treatment well    Behavior During Therapy  [facility] for tasks assessed/performed       History reviewed. No pertinent past medical history.  Past Surgical History:  Procedure Laterality Date  . CESAREAN SECTION     2 times    There were no vitals filed for this visit.  Subjective Assessment - 09/27/19 1235    Subjective  I felt really good after the dry needling. Overall improving, reaching behind the back still a little pain.    Currently in Pain?  No/denies    Multiple Pain Sites  No                       [facility] Adult PT Treatment/Exercise - 09/27/19 0001      Shoulder Exercises: Supine   Other Supine Exercises  Blue foam roll series for decompression and  anterior shoulder opening      Shoulder Exercises: Prone   Retraction  Strengthening;Right;10 reps;Weights    Retraction Weight (lbs)  3    Extension  Strengthening;Right;10 reps;Weights    Extension Weight (lbs)  2    Extension Limitations  VC scapular depression and retraction     Horizontal ABduction 1  AROM;Right;10 reps   2 sets   Horizontal ABduction 1 Weight (lbs)  1   fatigue   Horizontal ABduction 1 Limitations  with scapular retraction/depression       Shoulder Exercises: Standing   External Rotation  Strengthening;Right;10 reps;Theraband   2  sets   Theraband Level (Shoulder External Rotation)  Level 3 (Green)    Row  Both;15 reps;Theraband   2 sets   Theraband Level (Shoulder Row)  Level 4 (Blue)    Other Standing Exercises  Closed chain ball rolls in 4 directions 10x each: TC to inhibit UT      Shoulder Exercises: ROM/Strengthening   UBE (Upper Arm Bike)  Reverse 6 min L1.5               PT Short Term Goals - 09/22/19 1051      PT SHORT TERM GOAL #1   Title  Pt will demo consistency and independence with her initial HEP to improve shoulder strength.    Time  3    Period  Weeks    Status  New    Target Date  10/13/19        PT Long Term Goals - 09/22/19 1052      PT LONG TERM GOAL #1   Title  Pt will have 5/5 MMT of the Rt shoulder to improve her stability with daily activity and return to tennis.    Time  6    Period  Weeks    Status  New  Target Date  11/03/19      PT LONG TERM GOAL #2   Title  Pt will be able to reach behind her back with Rt UE to beyond the inferior angle of the scapula without pain.    Time  6    Period  Weeks    Status  New      PT LONG TERM GOAL #3   Title  Pt will report atleast 60% improvement in her Rt shoulder pain and popping throughout the day.    Time  6    Period  Weeks    Status  New      PT LONG TERM GOAL #4   Title  Pt will be able to complete tennis serve modulation at 75% effort x10 repetitions without increase in Rt shoulder pain/popping.    Time  6    Period  Weeks    Status  New            Plan - 09/27/19 1311    Clinical Impression Statement  Pt arrives painfree today. She reports excellent relief with the dry needling last session. Pt was shown how to use her foam roll at home to elicit a similiar response. Pt correctly demonstrated. Initially pt had difficulty correctly facilitating her scapular depressors during her exercises and would over grip through her upper traps. This improved throughout the session.    Personal Factors and  Comorbidities  Age;Time since onset of injury/illness/exacerbation;Fitness    Examination-Activity Limitations  Other    Examination-Participation Restrictions  Other    Stability/Clinical Decision Making  Stable/Uncomplicated    Rehab Potential  Excellent    PT Frequency  2x / week    PT Duration  6 weeks    PT Treatment/Interventions  ADLs/Self Care Home Management;Cryotherapy;Iontophoresis 4mg /ml Dexamethasone;Electrical Stimulation;Moist Heat;Neuromuscular re-education;Therapeutic exercise;Therapeutic activities;Patient/family education;Dry needling;Manual techniques;Passive range of motion;Taping    PT Next Visit Plan  Continue with post shoulder/scap/ER strength.    PT Home Exercise Plan  [facility]    Consulted and Agree with Plan of Care  Patient       Patient will benefit from skilled therapeutic intervention in order to improve the following deficits and impairments:  Impaired flexibility, Decreased strength, Decreased range of motion, Increased muscle spasms, Pain  Visit Diagnosis: Chronic right shoulder pain  Muscle weakness (generalized)  Other muscle spasm     Problem List Patient Active Problem List   Diagnosis Date Noted  . Trigger point of right shoulder region 09/13/2019  . Nonallopathic lesion of thoracic region 06/23/2019  . Nonallopathic lesion of cervical region 06/23/2019  . Nonallopathic lesion of rib cage 06/23/2019  . Chronic shoulder bursitis, right 05/14/2019    Abdulai Blaylock,[place], PTA 09/27/2019, 3:39 PM  [facility] [address] [place], [place], [address] Phone: [phone]   Fax:  [phone]  Name: Melissa [name] MRN: [id number] Date of Birth: 26-Oct-1970

## 2019-10-04 ENCOUNTER — Other Ambulatory Visit: Payer: Self-pay

## 2019-10-04 ENCOUNTER — Encounter: Payer: Self-pay | Admitting: Physical Therapy

## 2019-10-04 ENCOUNTER — Ambulatory Visit: Payer: 59 | Attending: Family Medicine | Admitting: Physical Therapy

## 2019-10-04 DIAGNOSIS — M6281 Muscle weakness (generalized): Secondary | ICD-10-CM | POA: Diagnosis not present

## 2019-10-04 DIAGNOSIS — M62838 Other muscle spasm: Secondary | ICD-10-CM | POA: Diagnosis not present

## 2019-10-04 DIAGNOSIS — G8929 Other chronic pain: Secondary | ICD-10-CM | POA: Insufficient documentation

## 2019-10-04 DIAGNOSIS — M25511 Pain in right shoulder: Secondary | ICD-10-CM | POA: Diagnosis not present

## 2019-10-04 NOTE — Therapy (Signed)
[facility] [address], [place], [place], [address] Phone: [phone]   Fax:  [phone]  Physical Therapy Treatment  Patient Details  Name: Melissa [name] MRN: [id number] Date of Birth: Jan 04, 1971 Referring Provider (PT): Hulan Saas, DO   Encounter Date: 10/04/2019  PT End of Session - 10/04/19 0758    Visit Number  4    Date for PT Re-Evaluation  11/03/19    Authorization Type  [employer]    Authorization Time Period  09/22/19 to 11/03/19    PT Start Time  0758    PT Stop Time  0847    PT Time Calculation (min)  49 min    Activity Tolerance  Patient tolerated treatment well    Behavior During Therapy  [facility] for tasks assessed/performed       History reviewed. No pertinent past medical history.  Past Surgical History:  Procedure Laterality Date  . CESAREAN SECTION     2 times    There were no vitals filed for this visit.  Subjective Assessment - 10/04/19 0801    Subjective  My muscles were really sore after last session at my shoulder blade. Front of my shoulder yesterday was super achey but today I am fine.    Currently in Pain?  No/denies    Multiple Pain Sites  No                       [facility] Adult PT Treatment/Exercise - 10/04/19 0001      Shoulder Exercises: Prone   Retraction  Strengthening;Right;20 reps;Weights    Retraction Weight (lbs)  4    Extension  Strengthening;Right;20 reps;Weights    Extension Weight (lbs)  2    Extension Limitations  VC scapular depression and retraction     External Rotation  AROM;Right;10 reps    Horizontal ABduction 1  AROM;Right;10 reps   2 sets   Horizontal ABduction 1 Weight (lbs)  1    Horizontal ABduction 1 Limitations  with scapular retraction/depression     Other Prone Exercises  Forearm plank with static ER yellow tband 10 sec       Shoulder Exercises: Sidelying   External Rotation  Strengthening;Right;20 reps;Weights     External Rotation Weight (lbs)  2      Shoulder Exercises: Standing   Other Standing Exercises  Closed chain ball rolls in 4 directions 10x each: TC to inhibit UT      Shoulder Exercises: ROM/Strengthening   UBE (Upper Arm Bike)  3x3 L2 with concurrent discussion of status      Shoulder Exercises: Stretch   External Rotation Stretch  --   On foam roll ER stretch static, then 5x stretch arms up & do   Other Shoulder Stretches  Shoulder stretching on foam roll      Manual Therapy   Manual Therapy  Soft tissue mobilization    Soft tissue mobilization  ddaday assit medial RT scapula, teres group,                PT Short Term Goals - 09/22/19 1051      PT SHORT TERM GOAL #1   Title  Pt will demo consistency and independence with her initial HEP to improve shoulder strength.    Time  3    Period  Weeks    Status  New    Target Date  10/13/19        PT Long  Term Goals - 09/22/19 1052      PT LONG TERM GOAL #1   Title  Pt will have 5/5 MMT of the Rt shoulder to improve her stability with daily activity and return to tennis.    Time  6    Period  Weeks    Status  New    Target Date  11/03/19      PT LONG TERM GOAL #2   Title  Pt will be able to reach behind her back with Rt UE to beyond the inferior angle of the scapula without pain.    Time  6    Period  Weeks    Status  New      PT LONG TERM GOAL #3   Title  Pt will report atleast 60% improvement in her Rt shoulder pain and popping throughout the day.    Time  6    Period  Weeks    Status  New      PT LONG TERM GOAL #4   Title  Pt will be able to complete tennis serve modulation at 75% effort x10 repetitions without increase in Rt shoulder pain/popping.    Time  6    Period  Weeks    Status  New            Plan - 10/04/19 0759    Clinical Impression Statement  Pt arrives pain free today. She did experience an "achey" anterior shoulder yesterday but it was only for 1 day.Head of pt's RT humeral  head appears anteriorly positioned. Pt felt laying supine on the foam roll in shoulder ER greatly stretched her shoulders. Pt was able to progress with her PREs strengthening her scapula/shoulder ER with no pain. Pt required very little verbal cuing for scapular depression during her stabilization & strengthening exercises today. Session was ended with soft tissue mobility around the scapula, deltoid and upper trap.    Personal Factors and Comorbidities  Age;Time since onset of injury/illness/exacerbation;Fitness    Examination-Activity Limitations  Other    Examination-Participation Restrictions  Other    Stability/Clinical Decision Making  Stable/Uncomplicated    Rehab Potential  Excellent    PT Frequency  2x / week    PT Duration  6 weeks    PT Treatment/Interventions  ADLs/Self Care Home Management;Cryotherapy;Iontophoresis 4mg /ml Dexamethasone;Electrical Stimulation;Moist Heat;Neuromuscular re-education;Therapeutic exercise;Therapeutic activities;Patient/family education;Dry needling;Manual techniques;Passive range of motion;Taping    PT Next Visit Plan  Discuss need for further DN, MMT scapula & ER muscles, Post shoulder strength, opening up anterior shoulder/pectorals    PT Home Exercise Plan  [facility]    Consulted and Agree with Plan of Care  Patient       Patient will benefit from skilled therapeutic intervention in order to improve the following deficits and impairments:  Impaired flexibility, Decreased strength, Decreased range of motion, Increased muscle spasms, Pain  Visit Diagnosis: Chronic right shoulder pain  Muscle weakness (generalized)  Other muscle spasm     Problem List Patient Active Problem List   Diagnosis Date Noted  . Trigger point of right shoulder region 09/13/2019  . Nonallopathic lesion of thoracic region 06/23/2019  . Nonallopathic lesion of cervical region 06/23/2019  . Nonallopathic lesion of rib cage 06/23/2019  . Chronic shoulder bursitis, right  05/14/2019    Sugar Vanzandt,[place], PTA 10/04/2019, 8:52 AM  [facility] [address] [place], [place], [address] Phone: [phone]   Fax:  [phone]  Name: LORELEE CORNELSEN MRN:  [id number] Date of Birth: 1970-08-18

## 2019-10-08 ENCOUNTER — Other Ambulatory Visit: Payer: Self-pay

## 2019-10-08 ENCOUNTER — Ambulatory Visit: Payer: 59 | Admitting: Physical Therapy

## 2019-10-08 DIAGNOSIS — M62838 Other muscle spasm: Secondary | ICD-10-CM | POA: Diagnosis not present

## 2019-10-08 DIAGNOSIS — M6281 Muscle weakness (generalized): Secondary | ICD-10-CM

## 2019-10-08 DIAGNOSIS — G8929 Other chronic pain: Secondary | ICD-10-CM

## 2019-10-08 DIAGNOSIS — M25511 Pain in right shoulder: Secondary | ICD-10-CM | POA: Diagnosis not present

## 2019-10-08 NOTE — Therapy (Signed)
[facility] [address] [place], [place], [address] Phone: [phone]   Fax:  [phone]  Physical Therapy Treatment  Patient Details  Name: Melissa [name] MRN: [id number] Date of Birth: October 01, 1970 Referring Provider (PT): Hulan Saas, DO   Encounter Date: 10/08/2019  PT End of Session - 10/08/19 1149    Visit Number  5    Date for PT Re-Evaluation  11/03/19    Authorization Type  [employer]    Authorization Time Period  09/22/19 to 11/03/19    PT Start Time  0853   pt late   PT Stop Time  0931   dry needling and heat   PT Time Calculation (min)  38 min    Activity Tolerance  Patient tolerated treatment well       No past medical history on file.  Past Surgical History:  Procedure Laterality Date  . CESAREAN SECTION     2 times    There were no vitals filed for this visit.  Subjective Assessment - 10/08/19 0854    Subjective  Shoulder is good.  Exercises are challenges.  Some tightness on right levator scap  muscle and left upper trap.  I liked the dry needling.    Currently in Pain?  Yes    Pain Score  4     Pain Location  Shoulder    Pain Orientation  Right    Pain Type  Acute pain                       [facility] Adult PT Treatment/Exercise - 10/08/19 0001      Self-Care   Other Self-Care Comments   foam roll info       Moist Heat Therapy   Number Minutes Moist Heat  5 Minutes    Moist Heat Location  Shoulder      Manual Therapy   Soft tissue mobilization  bil upper traps, right levator scap, rhomboids, scapular muscles, deltoids        Trigger Point Dry Needling - 10/08/19 0001    Consent Given?  Yes    Muscles Treated Upper Quadrant  Deltoid    Upper Trapezius Response  Twitch reponse elicited;Palpable increased muscle length    Rhomboids Response  Palpable increased muscle length    Supraspinatus Response  Palpable increased muscle length    Infraspinatus  Response  Palpable increased muscle length    Subscapularis Response  Palpable increased muscle length    Deltoid Response  Palpable increased muscle length             PT Short Term Goals - 10/08/19 1158      PT SHORT TERM GOAL #1   Title  Pt will demo consistency and independence with her initial HEP to improve shoulder strength.    Status  Achieved        PT Long Term Goals - 09/22/19 1052      PT LONG TERM GOAL #1   Title  Pt will have 5/5 MMT of the Rt shoulder to improve her stability with daily activity and return to tennis.    Time  6    Period  Weeks    Status  New    Target Date  11/03/19      PT LONG TERM GOAL #2   Title  Pt will be able to reach behind her back with Rt UE to beyond the inferior angle of  the scapula without pain.    Time  6    Period  Weeks    Status  New      PT LONG TERM GOAL #3   Title  Pt will report atleast 60% improvement in her Rt shoulder pain and popping throughout the day.    Time  6    Period  Weeks    Status  New      PT LONG TERM GOAL #4   Title  Pt will be able to complete tennis serve modulation at 75% effort x10 repetitions without increase in Rt shoulder pain/popping.    Time  6    Period  Weeks    Status  New            Plan - 10/08/19 1150    Clinical Impression Statement  The patient demonstrates good compliance with HEP.  She had a positive response to initial dry needling and is receptive to additional DN and manual therapy.  Decreased tender points overall however new onset of left upper trap tender points, most likely compensatory strategy.  Improved soft tissue mobility following treatment session.  Therapist monitoring response with all treatment interventions.    Personal Factors and Comorbidities  Age;Time since onset of injury/illness/exacerbation;Fitness    Rehab Potential  Excellent    PT Frequency  2x / week    PT Duration  6 weeks    PT Treatment/Interventions  ADLs/Self Care Home  Management;Cryotherapy;Iontophoresis 4mg /ml Dexamethasone;Electrical Stimulation;Moist Heat;Neuromuscular re-education;Therapeutic exercise;Therapeutic activities;Patient/family education;Dry needling;Manual techniques;Passive range of motion;Taping    PT Next Visit Plan  DN, MMT scapula & ER muscles, check internal rotation; Post shoulder strength, opening up anterior shoulder/pectorals    PT Home Exercise Plan  [facility]    Consulted and Agree with Plan of Care  Patient       Patient will benefit from skilled therapeutic intervention in order to improve the following deficits and impairments:  Impaired flexibility, Decreased strength, Decreased range of motion, Increased muscle spasms, Pain  Visit Diagnosis: Chronic right shoulder pain  Muscle weakness (generalized)  Other muscle spasm     Problem List Patient Active Problem List   Diagnosis Date Noted  . Trigger point of right shoulder region 09/13/2019  . Nonallopathic lesion of thoracic region 06/23/2019  . Nonallopathic lesion of cervical region 06/23/2019  . Nonallopathic lesion of rib cage 06/23/2019  . Chronic shoulder bursitis, right 05/14/2019   Ruben Im, PT 10/08/19 11:59 AM Phone: [phone] Fax: [phone] Alvera Singh 10/08/2019, 11:59 AM  [facility] [address], [place], [place], [address] Phone: [phone]   Fax:  [phone]  Name: Melissa [name] MRN: [id number] Date of Birth: 09/18/1970

## 2019-10-11 ENCOUNTER — Ambulatory Visit: Payer: 59 | Admitting: Family Medicine

## 2019-10-12 ENCOUNTER — Encounter: Payer: Self-pay | Admitting: Physical Therapy

## 2019-10-12 ENCOUNTER — Ambulatory Visit: Payer: 59 | Admitting: Physical Therapy

## 2019-10-12 ENCOUNTER — Other Ambulatory Visit: Payer: Self-pay

## 2019-10-12 DIAGNOSIS — G8929 Other chronic pain: Secondary | ICD-10-CM | POA: Diagnosis not present

## 2019-10-12 DIAGNOSIS — M25511 Pain in right shoulder: Secondary | ICD-10-CM | POA: Diagnosis not present

## 2019-10-12 DIAGNOSIS — M6281 Muscle weakness (generalized): Secondary | ICD-10-CM | POA: Diagnosis not present

## 2019-10-12 DIAGNOSIS — M62838 Other muscle spasm: Secondary | ICD-10-CM | POA: Diagnosis not present

## 2019-10-12 NOTE — Patient Instructions (Signed)
     Dynamic warm up:  Foam roll vertically along spine, palms up, work up to T position 2-3 minutes  Sidelying open books 10x   Hands/knees position:  "thread the needle"  Right back of hand on foam roll 10x   Hands/knees position:  Pinky side hand on foam roll childs pose 5x   Middle trap strengthening:  Red band steering wheel 10x  On wall:  Red band around wrists, right hand stepping 10x   On wall:  Pinky side hand to wall, backward C's or scoops 3 levels on wall 5x     Ruben Im PT [facility] [address], [place],  [address] Phone # [phone] Fax [phone]

## 2019-10-12 NOTE — Therapy (Signed)
[facility] [address] [place], [place], [address] Phone: [phone]   Fax:  [phone]  Physical Therapy Treatment  Patient Details  Name: Melissa [name] MRN: [id number] Date of Birth: June 15, 1971 Referring Provider (PT): Hulan Saas, DO   Encounter Date: 10/12/2019  PT End of Session - 10/12/19 1836    Visit Number  6    Date for PT Re-Evaluation  11/03/19    Authorization Type  [employer]    Authorization Time Period  09/22/19 to 11/03/19    PT Start Time  0845    PT Stop Time  0928    PT Time Calculation (min)  43 min    Activity Tolerance  Patient tolerated treatment well       History reviewed. No pertinent past medical history.  Past Surgical History:  Procedure Laterality Date  . CESAREAN SECTION     2 times    There were no vitals filed for this visit.  Subjective Assessment - 10/12/19 0852    Subjective  My shoulder wasn't as sore as I thought.  I had a weird throbby pain lasting an hour while at soccer.  Everything else was great.   I got my roller.  Did exercises at home.    Currently in Pain?  No/denies    Pain Score  0-No pain    Pain Location  Shoulder    Pain Orientation  Right                       [facility] Adult PT Treatment/Exercise - 10/12/19 0001      Shoulder Exercises: Sidelying   Other Sidelying Exercises  open books right 10x       Shoulder Exercises: Standing   Shoulder Elevation Limitations  red band 3 ways on way 10x     Other Standing Exercises  red band around wrists 15x steering wheel     Other Standing Exercises  3 scoops on wall red band       Shoulder Exercises: ROM/Strengthening   Other ROM/Strengthening Exercises  thoracic extension with foam roll 10x     Other ROM/Strengthening Exercises  quadruped thread the needle 10x       Shoulder Exercises: Stretch   Other Shoulder Stretches  foam roll T pec stretch     Other Shoulder  Stretches  childs pose with foam roll 10x              PT Education - 10/12/19 1835    Education Details  dynamic warm ups; red band middle and lower trap strengthening    Person(s) Educated  Patient    Methods  Explanation;Demonstration;Handout    Comprehension  Returned demonstration;Verbalized understanding       PT Short Term Goals - 10/08/19 1158      PT SHORT TERM GOAL #1   Title  Pt will demo consistency and independence with her initial HEP to improve shoulder strength.    Status  Achieved        PT Long Term Goals - 09/22/19 1052      PT LONG TERM GOAL #1   Title  Pt will have 5/5 MMT of the Rt shoulder to improve her stability with daily activity and return to tennis.    Time  6    Period  Weeks    Status  New    Target Date  11/03/19      PT LONG TERM  GOAL #2   Title  Pt will be able to reach behind her back with Rt UE to beyond the inferior angle of the scapula without pain.    Time  6    Period  Weeks    Status  New      PT LONG TERM GOAL #3   Title  Pt will report atleast 60% improvement in her Rt shoulder pain and popping throughout the day.    Time  6    Period  Weeks    Status  New      PT LONG TERM GOAL #4   Title  Pt will be able to complete tennis serve modulation at 75% effort x10 repetitions without increase in Rt shoulder pain/popping.    Time  6    Period  Weeks    Status  New            Plan - 10/12/19 1836    Clinical Impression Statement  Patient's primary aggravating movement is external rotation at 90 degrees abduction.  Following anterior shoulder stretching and scapular strengthening focused exercises, she is able to perform that specific motion painfree.  Onset of muscular fatigue rather quickly with exercises.  Therapist closely monitoring response with all and providing cues to avoid compensatory shoulder hike.    Rehab Potential  Excellent    PT Frequency  2x / week    PT Duration  6 weeks    PT  Treatment/Interventions  ADLs/Self Care Home Management;Cryotherapy;Iontophoresis 4mg /ml Dexamethasone;Electrical Stimulation;Moist Heat;Neuromuscular re-education;Therapeutic exercise;Therapeutic activities;Patient/family education;Dry needling;Manual techniques;Passive range of motion;Taping    PT Next Visit Plan  DN, MMT scapula & ER muscles, check internal rotation; Post shoulder strength, opening up anterior shoulder/pectorals    PT Home Exercise Plan  [facility]       Patient will benefit from skilled therapeutic intervention in order to improve the following deficits and impairments:  Impaired flexibility, Decreased strength, Decreased range of motion, Increased muscle spasms, Pain  Visit Diagnosis: Chronic right shoulder pain  Muscle weakness (generalized)  Other muscle spasm     Problem List Patient Active Problem List   Diagnosis Date Noted  . Trigger point of right shoulder region 09/13/2019  . Nonallopathic lesion of thoracic region 06/23/2019  . Nonallopathic lesion of cervical region 06/23/2019  . Nonallopathic lesion of rib cage 06/23/2019  . Chronic shoulder bursitis, right 05/14/2019   Ruben Im, PT 10/12/19 6:47 PM Phone: [phone] Fax: [phone] Alvera Singh 10/12/2019, 6:47 PM  [facility] [address], [place], [place], [address] Phone: [phone]   Fax:  [phone]  Name: Melissa [name] MRN: [id number] Date of Birth: 1970-09-30

## 2019-10-15 ENCOUNTER — Other Ambulatory Visit: Payer: Self-pay

## 2019-10-15 ENCOUNTER — Ambulatory Visit: Payer: 59 | Admitting: Physical Therapy

## 2019-10-15 DIAGNOSIS — M62838 Other muscle spasm: Secondary | ICD-10-CM

## 2019-10-15 DIAGNOSIS — G8929 Other chronic pain: Secondary | ICD-10-CM

## 2019-10-15 DIAGNOSIS — M25511 Pain in right shoulder: Secondary | ICD-10-CM | POA: Diagnosis not present

## 2019-10-15 DIAGNOSIS — M6281 Muscle weakness (generalized): Secondary | ICD-10-CM | POA: Diagnosis not present

## 2019-10-15 NOTE — Patient Instructions (Signed)

## 2019-10-15 NOTE — Therapy (Signed)
[facility] [address], [place], [place], [address] Phone: [phone]   Fax:  [phone]  Physical Therapy Treatment  Patient Details  Name: Melissa [name] MRN: [id number] Date of Birth: 09-Sep-1970 Referring Provider (PT): Hulan Saas, DO   Encounter Date: 10/15/2019  PT End of Session - 10/15/19 1205    Visit Number  7    Date for PT Re-Evaluation  11/03/19    Authorization Type  [employer]    Authorization Time Period  09/22/19 to 11/03/19    PT Start Time  0850    PT Stop Time  0930    PT Time Calculation (min)  40 min    Activity Tolerance  Patient tolerated treatment well       No past medical history on file.  Past Surgical History:  Procedure Laterality Date  . CESAREAN SECTION     2 times    There were no vitals filed for this visit.  Subjective Assessment - 10/15/19 0852    Subjective  My shoulder is feeling good.  Felt great after last time.  Slept well.  Used foam roller this week.    Currently in Pain?  No/denies    Pain Score  0-No pain                       [facility] Adult PT Treatment/Exercise - 10/15/19 0001      Shoulder Exercises: Standing   Other Standing Exercises  green band horizontal rows 10x    Other Standing Exercises  green band Ws 10x       Shoulder Exercises: ROM/Strengthening   Pushups  15 reps    Pushups Limitations  counter top push ups plus    Other ROM/Strengthening Exercises  quadruped thread the needle combined with open books 10x       Shoulder Exercises: Stretch   Other Shoulder Stretches  foam roll T pec stretch     Other Shoulder Stretches  childs pose with foam roll 10x       Iontophoresis   Type of Iontophoresis  Dexamethasone    Location  posterior right shoulder     Dose  4 mg/ml     Time  4-6 hour patch              PT Education - 10/15/19 1202    Education Details  ionto instructions    Person(s) Educated   Patient    Methods  Handout;Explanation    Comprehension  Verbalized understanding       PT Short Term Goals - 10/08/19 1158      PT SHORT TERM GOAL #1   Title  Pt will demo consistency and independence with her initial HEP to improve shoulder strength.    Status  Achieved        PT Long Term Goals - 09/22/19 1052      PT LONG TERM GOAL #1   Title  Pt will have 5/5 MMT of the Rt shoulder to improve her stability with daily activity and return to tennis.    Time  6    Period  Weeks    Status  New    Target Date  11/03/19      PT LONG TERM GOAL #2   Title  Pt will be able to reach behind her back with Rt UE to beyond the inferior angle of the scapula without pain.    Time  6    Period  Weeks    Status  New      PT LONG TERM GOAL #3   Title  Pt will report atleast 60% improvement in her Rt shoulder pain and popping throughout the day.    Time  6    Period  Weeks    Status  New      PT LONG TERM GOAL #4   Title  Pt will be able to complete tennis serve modulation at 75% effort x10 repetitions without increase in Rt shoulder pain/popping.    Time  6    Period  Weeks    Status  New            Plan - 10/15/19 1205    Clinical Impression Statement  The patient reports a good overall week with her shoulder with usual ADLs.  Good response with foam roll mobility ex's.  She does still have pain/soreness with shoulder internal rotation and with abduction/external rotation, with mild pain produced on the posterior and lateral aspects of her shoulder.  Initiated trial of ionto to decrease inflammatory response.  Therapist monitoring response with all treatment interventions.    Personal Factors and Comorbidities  Age;Time since onset of injury/illness/exacerbation;Fitness    Rehab Potential  Excellent    PT Frequency  2x / week    PT Duration  6 weeks    PT Treatment/Interventions  ADLs/Self Care Home Management;Cryotherapy;Iontophoresis 4mg /ml Dexamethasone;Electrical  Stimulation;Moist Heat;Neuromuscular re-education;Therapeutic exercise;Therapeutic activities;Patient/family education;Dry needling;Manual techniques;Passive range of motion;Taping    PT Next Visit Plan  assess response to and continue ionto trial;  DN as needed;  glenohumeral and scapular strengthening    PT Home Exercise Plan  [facility]       Patient will benefit from skilled therapeutic intervention in order to improve the following deficits and impairments:  Impaired flexibility, Decreased strength, Decreased range of motion, Increased muscle spasms, Pain  Visit Diagnosis: Chronic right shoulder pain  Muscle weakness (generalized)  Other muscle spasm     Problem List Patient Active Problem List   Diagnosis Date Noted  . Trigger point of right shoulder region 09/13/2019  . Nonallopathic lesion of thoracic region 06/23/2019  . Nonallopathic lesion of cervical region 06/23/2019  . Nonallopathic lesion of rib cage 06/23/2019  . Chronic shoulder bursitis, right 05/14/2019   Ruben Im, PT 10/15/19 12:14 PM Phone: [phone] Fax: [phone]  Alvera Singh 10/15/2019, 12:13 PM  [facility] [address] [place], [place], [address] Phone: [phone]   Fax:  [phone]  Name: Melissa [name] MRN: [id number] Date of Birth: August 02, 1971

## 2019-10-21 ENCOUNTER — Ambulatory Visit: Payer: 59

## 2019-10-21 ENCOUNTER — Other Ambulatory Visit: Payer: Self-pay

## 2019-10-21 DIAGNOSIS — G8929 Other chronic pain: Secondary | ICD-10-CM | POA: Diagnosis not present

## 2019-10-21 DIAGNOSIS — M25511 Pain in right shoulder: Secondary | ICD-10-CM | POA: Diagnosis not present

## 2019-10-21 DIAGNOSIS — M6281 Muscle weakness (generalized): Secondary | ICD-10-CM | POA: Diagnosis not present

## 2019-10-21 DIAGNOSIS — M62838 Other muscle spasm: Secondary | ICD-10-CM

## 2019-10-21 NOTE — Patient Instructions (Signed)
Access Code: [facility] URL: [email or web address] Date: 10/21/2019 Prepared by: Claiborne Billings  Supine Shoulder Horizontal Abduction with Resistance - 1 x daily - 7 x weekly - 2 sets - 10 reps Supine Bilateral Shoulder External Rotation with Resistance - 1 x daily - 7 x weekly - 2 sets - 10 reps Supine PNF D2 Flexion with Resistance - 1 x daily - 7 x weekly - 2 sets - 10 reps

## 2019-10-21 NOTE — Therapy (Signed)
[facility] [address] [place], [place], [address] Phone: [phone]   Fax:  [phone]  Physical Therapy Treatment  Patient Details  Name: Melissa [name] MRN: [id number] Date of Birth: 03/28/1971 Referring Provider (PT): Hulan Saas, DO   Encounter Date: 10/21/2019  PT End of Session - 10/21/19 1143    Visit Number  8    Date for PT Re-Evaluation  11/03/19    Authorization Type  [employer]    PT Start Time  1100    PT Stop Time  1140    PT Time Calculation (min)  40 min    Activity Tolerance  Patient tolerated treatment well    Behavior During Therapy  [facility] for tasks assessed/performed       History reviewed. No pertinent past medical history.  Past Surgical History:  Procedure Laterality Date  . CESAREAN SECTION     2 times    There were no vitals filed for this visit.  Subjective Assessment - 10/21/19 1106    Subjective  My am feeling better.    Currently in Pain?  No/denies   no pain this week                      [facility] Adult PT Treatment/Exercise - 10/21/19 0001      Shoulder Exercises: Supine   Horizontal ABduction  Strengthening;Both;20 reps;Theraband    Theraband Level (Shoulder Horizontal ABduction)  Level 3 (Green)    Horizontal ABduction Limitations  on foam roll    External Rotation  Strengthening;20 reps;Theraband    Theraband Level (Shoulder External Rotation)  Level 3 (Green)    External Rotation Limitations  on foam roll    Diagonals  Strengthening;Right;20 reps;Theraband    Theraband Level (Shoulder Diagonals)  Level 3 (Green)      Shoulder Exercises: Sidelying   Other Sidelying Exercises  open books right and Lt 10x       Shoulder Exercises: Standing   Other Standing Exercises  counter top push ups with a plus 2x10      Shoulder Exercises: ROM/Strengthening   UBE (Upper Arm Bike)  3x3 L2 with concurrent discussion of status      Iontophoresis   Type of Iontophoresis  Dexamethasone    Location  posterior right shoulder     Dose  4 mg/ml    #2   Time  4-6 hour patch              PT Education - 10/21/19 1122    Education Details  Access Code: [email or web address]    Person(s) Educated  Patient    Methods  Explanation;Demonstration;Handout    Comprehension  Verbalized understanding;Returned demonstration       PT Short Term Goals - 10/08/19 1158      PT SHORT TERM GOAL #1   Title  Pt will demo consistency and independence with her initial HEP to improve shoulder strength.    Status  Achieved        PT Long Term Goals - 10/21/19 1128      PT LONG TERM GOAL #2   Title  Pt will be able to reach behind her back with Rt UE to beyond the inferior angle of the scapula without pain.      PT LONG TERM GOAL #3   Title  Pt will report atleast 60% improvement in her Rt shoulder pain and popping throughout the day.    Baseline  88%    Status  Achieved      PT LONG TERM GOAL #4   Title  Pt will be able to complete tennis serve modulation at 75% effort x10 repetitions without increase in Rt shoulder pain/popping.    Baseline  hasn't tried yet    Time  6    Period  Weeks    Status  On-going      PT LONG TERM GOAL #5   Title  reaching overhead with pain decreased >/= 75%    Baseline  no pain    Status  Achieved            Plan - 10/21/19 1131    Clinical Impression Statement  Pt denies any pain x 1 week in the Rt shoulder.  PT issued new HEP on foam roll to emphasize scapular activation.  Pt required minor verbal cues for technique.  Pt reports 88% overall reduction in pain since the start of care and is able to reach overhead without pain.  Pt will try tennis serve at home to determine if painful.  Pt will continue to benefit from skilled PT to improve Lt shoulder strength and functional use.    PT Frequency  2x / week    PT Duration  6 weeks    PT Treatment/Interventions  ADLs/Self Care Home  Management;Cryotherapy;Iontophoresis 4mg /ml Dexamethasone;Electrical Stimulation;Moist Heat;Neuromuscular re-education;Therapeutic exercise;Therapeutic activities;Patient/family education;Dry needling;Manual techniques;Passive range of motion;Taping    PT Next Visit Plan  ionto #3, strength, flexibility    Consulted and Agree with Plan of Care  Patient       Patient will benefit from skilled therapeutic intervention in order to improve the following deficits and impairments:  Impaired flexibility, Decreased strength, Decreased range of motion, Increased muscle spasms, Pain  Visit Diagnosis: Chronic right shoulder pain  Muscle weakness (generalized)  Other muscle spasm     Problem List Patient Active Problem List   Diagnosis Date Noted  . Trigger point of right shoulder region 09/13/2019  . Nonallopathic lesion of thoracic region 06/23/2019  . Nonallopathic lesion of cervical region 06/23/2019  . Nonallopathic lesion of rib cage 06/23/2019  . Chronic shoulder bursitis, right 05/14/2019     Sigurd Sos, PT 10/21/19 11:45 AM  [facility] [address] [place], [place], [address] Phone: [phone]   Fax:  [phone]  Name: Melissa [name] MRN: [id number] Date of Birth: Jan 07, 1971

## 2019-10-26 ENCOUNTER — Encounter: Payer: Self-pay | Admitting: Physical Therapy

## 2019-10-26 ENCOUNTER — Ambulatory Visit: Payer: 59 | Admitting: Physical Therapy

## 2019-10-26 ENCOUNTER — Other Ambulatory Visit: Payer: Self-pay

## 2019-10-26 DIAGNOSIS — M6281 Muscle weakness (generalized): Secondary | ICD-10-CM | POA: Diagnosis not present

## 2019-10-26 DIAGNOSIS — M25511 Pain in right shoulder: Secondary | ICD-10-CM | POA: Diagnosis not present

## 2019-10-26 DIAGNOSIS — M62838 Other muscle spasm: Secondary | ICD-10-CM | POA: Diagnosis not present

## 2019-10-26 DIAGNOSIS — G8929 Other chronic pain: Secondary | ICD-10-CM

## 2019-10-26 NOTE — Therapy (Addendum)
[facility] [address] [place], [place], [address] Phone: [phone]   Fax:  [phone]  Physical Therapy Treatment  Patient Details  Name: Melissa [name] MRN: [id number] Date of Birth: 1971-03-21 Referring Provider (PT): Hulan Saas, DO   Encounter Date: 10/26/2019  PT End of Session - 10/26/19 1009    Visit Number  9   Date for PT Re-Evaluation  11/03/19    Authorization Type  [employer]    Authorization Time Period  09/22/19 to 11/03/19    PT Start Time  0851    PT Stop Time  0929    PT Time Calculation (min)  38 min    Activity Tolerance  Patient tolerated treatment well       History reviewed. No pertinent past medical history.  Past Surgical History:  Procedure Laterality Date  . CESAREAN SECTION     2 times    There were no vitals filed for this visit.  Subjective Assessment - 10/26/19 0851    Subjective  My shoulder is good.  I tried a tennis serve and it seemed fine, a little a pop but not painful.  No problem carrying luggage to the beach.  Ionto patch is helping.    Currently in Pain?  No/denies    Pain Score  0-No pain    Pain Location  Shoulder    Pain Orientation  Right                       [facility] Adult PT Treatment/Exercise - 10/26/19 0001      Shoulder Exercises: Standing   Flexion  Strengthening;Right;10 reps   mirror feedback    Shoulder Flexion Weight (lbs)  2    ABduction  Strengthening;Right;10 reps;Weights    Shoulder ABduction Weight (lbs)  2    Shoulder Elevation Limitations  scaption 2# 10x with mirror feedback     Other Standing Exercises  wall slides with red band around hands 10x     Other Standing Exercises  large red ball ABCs on wall       Shoulder Exercises: ROM/Strengthening   UBE (Upper Arm Bike)  3x3 L2 with concurrent discussion of status    [place]  L10, 18, 25 10x each     Other ROM/Strengthening Exercises  red band around  wrists backward Cs 10x       Iontophoresis   Type of Iontophoresis  Dexamethasone    Location  posterior right shoulder     Dose  4 mg/ml    #3   Time  4-6 hour patch                PT Short Term Goals - 10/08/19 1158      PT SHORT TERM GOAL #1   Title  Pt will demo consistency and independence with her initial HEP to improve shoulder strength.    Status  Achieved        PT Long Term Goals - 10/21/19 1128      PT LONG TERM GOAL #2   Title  Pt will be able to reach behind her back with Rt UE to beyond the inferior angle of the scapula without pain.      PT LONG TERM GOAL #3   Title  Pt will report atleast 60% improvement in her Rt shoulder pain and popping throughout the day.    Baseline  88%    Status  Achieved  PT LONG TERM GOAL #4   Title  Pt will be able to complete tennis serve modulation at 75% effort x10 repetitions without increase in Rt shoulder pain/popping.    Baseline  hasn't tried yet    Time  6    Period  Weeks    Status  On-going      PT LONG TERM GOAL #5   Title  reaching overhead with pain decreased >/= 75%    Baseline  no pain    Status  Achieved            Plan - 10/26/19 1009    Clinical Impression Statement  The patient has no pain with functional activities.  She is able to perform 2# resisted elevation without pain and without compensatory shoulder shrugging.  She demonstrates much improved scapular control and activation of middle and lower traps.  Good response to iontophoresis.   Therapist monitoring response with all interventions.    Stability/Clinical Decision Making  Stable/Uncomplicated    Rehab Potential  Excellent    PT Frequency  2x / week    PT Duration  6 weeks    PT Treatment/Interventions  ADLs/Self Care Home Management;Cryotherapy;Iontophoresis 45m/ml Dexamethasone;Electrical Stimulation;Moist Heat;Neuromuscular re-education;Therapeutic exercise;Therapeutic activities;Patient/family education;Dry needling;Manual  techniques;Passive range of motion;Taping    PT Next Visit Plan  ionto trial;  check FOTO;  MMT and goal check;  Possible discharge if majority of goals met   [facility]      Patient will benefit from skilled therapeutic intervention in order to improve the following deficits and impairments:  Impaired flexibility, Decreased strength, Decreased range of motion, Increased muscle spasms, Pain  Visit Diagnosis: Chronic right shoulder pain  Muscle weakness (generalized)  Other muscle spasm     Problem List Patient Active Problem List   Diagnosis Date Noted  . Trigger point of right shoulder region 09/13/2019  . Nonallopathic lesion of thoracic region 06/23/2019  . Nonallopathic lesion of cervical region 06/23/2019  . Nonallopathic lesion of rib cage 06/23/2019  . Chronic shoulder bursitis, right 05/14/2019   SRuben Im PT 10/26/19 10:20 AM Phone: [phone]: [phone] Singh3/23/2021, 10:19 AM  [facility] [address] [place] [address]: [phone]  Fax:  [phone] Name: JOLETTA BUEHRINGMRN: [id number] of Birth: 103-21-1972

## 2019-10-29 ENCOUNTER — Ambulatory Visit: Payer: 59 | Admitting: Physical Therapy

## 2019-10-29 ENCOUNTER — Encounter: Payer: Self-pay | Admitting: Physical Therapy

## 2019-10-29 ENCOUNTER — Other Ambulatory Visit: Payer: Self-pay

## 2019-10-29 DIAGNOSIS — M25511 Pain in right shoulder: Secondary | ICD-10-CM | POA: Diagnosis not present

## 2019-10-29 DIAGNOSIS — M6281 Muscle weakness (generalized): Secondary | ICD-10-CM

## 2019-10-29 DIAGNOSIS — M62838 Other muscle spasm: Secondary | ICD-10-CM | POA: Diagnosis not present

## 2019-10-29 DIAGNOSIS — G8929 Other chronic pain: Secondary | ICD-10-CM | POA: Diagnosis not present

## 2019-10-29 NOTE — Therapy (Signed)
[facility] [address] [place], [place], [address] Phone: [phone]   Fax:  [phone]  Physical Therapy Treatment/Discharge Summary   Patient Details  Name: Melissa [name] MRN: [id number] Date of Birth: 03-01-1971 Referring Provider (PT): Hulan Saas, DO   Encounter Date: 10/29/2019  PT End of Session - 10/29/19 1120    Visit Number  10    Date for PT Re-Evaluation  11/03/19    Authorization Type  [employer]    Authorization Time Period  09/22/19 to 11/03/19    PT Start Time  0845    PT Stop Time  0915   discharge visit   PT Time Calculation (min)  30 min       History reviewed. No pertinent past medical history.  Past Surgical History:  Procedure Laterality Date  . CESAREAN SECTION     2 times    There were no vitals filed for this visit.  Subjective Assessment - 10/29/19 0848    Subjective  Shoulder feeling good this week.  I had once incident.  I moved a weird way and felt a pop.    Currently in Pain?  No/denies    Pain Score  0-No pain    Pain Orientation  Right         [facility] PT Assessment - 10/29/19 0001      Observation/Other Assessments   Focus on Therapeutic Outcomes (FOTO)   17% limitation      AROM   Overall AROM Comments  active ROM WNL except reach behind back lateral upper arm pain       Strength   Overall Strength Comments  all 5/5 MMT except Rt shoulder ER in 0/90 deg abd: 4/5, scapular strength 4/5;  able to plank without pain                    [facility] Adult PT Treatment/Exercise - 10/29/19 0001      Shoulder Exercises: ROM/Strengthening   UBE (Upper Arm Bike)  3x3 L2 with concurrent discussion of status    Other ROM/Strengthening Exercises  discussion on HEP progression with focus on scapular stabilization on wall and prone Is,Ts,Ys       Iontophoresis   Type of Iontophoresis  Dexamethasone    Location  posterior right shoulder     Dose  4  mg/ml    #4   Time  4-6 hour patch                PT Short Term Goals - 10/29/19 1126      PT SHORT TERM GOAL #1   Title  Pt will demo consistency and independence with her initial HEP to improve shoulder strength.    Status  Achieved      PT SHORT TERM GOAL #2   Title  pain with reaching behind her decreased >/= 25%    Status  Achieved        PT Long Term Goals - 10/29/19 0900      PT LONG TERM GOAL #1   Title  Pt will have 5/5 MMT of the Rt shoulder to improve her stability with daily activity and return to tennis.    Status  Partially Met      PT LONG TERM GOAL #2   Title  Pt will be able to reach behind her back with Rt UE to beyond the inferior angle of the scapula without pain.    Status  Partially Met      PT LONG TERM GOAL #3   Title  Pt will report atleast 60% improvement in her Rt shoulder pain and popping throughout the day.    Baseline  88%    Status  Achieved      PT LONG TERM GOAL #4   Title  Pt will be able to complete tennis serve modulation at 75% effort x10 repetitions without increase in Rt shoulder pain/popping.    Status  Achieved      PT LONG TERM GOAL #5   Title  reaching overhead with pain decreased >/= 75%    Status  Achieved            Plan - 10/29/19 0904    Clinical Impression Statement  The patient reports satisfaction in her current functional level and pain reduction.  She has full shoulder ROM and painfree except for mild discomfort with internal rotation motion behind her back.  Her strength is grossly 4 to 4+/5 in glenohumeral and scapular muscles.  Her FOTO functional outcome score has improved from a 30% limitation to 17% indicating improved function with less pain.  She has met the majority of rehab goals.  Will discharge from PT at this time.    PT Home Exercise Plan  [facility]       Patient will benefit from skilled therapeutic intervention in order to improve the following deficits and impairments:     Visit  Diagnosis: Chronic right shoulder pain  Muscle weakness (generalized)  Other muscle spasm    PHYSICAL THERAPY DISCHARGE SUMMARY  Visits from Start of Care: 10  Current functional level related to goals / functional outcomes: See clinical impressions above   Remaining deficits: As above   Education / Equipment:HEP  Plan: Patient agrees to discharge.  Patient goals were met. Patient is being discharged due to meeting the stated rehab goals.  ?????         Problem List Patient Active Problem List   Diagnosis Date Noted  . Trigger point of right shoulder region 09/13/2019  . Nonallopathic lesion of thoracic region 06/23/2019  . Nonallopathic lesion of cervical region 06/23/2019  . Nonallopathic lesion of rib cage 06/23/2019  . Chronic shoulder bursitis, right 05/14/2019   Ruben Im, PT 10/29/19 11:27 AM Phone: [phone] Fax: [phone] Alvera Singh 10/29/2019, 11:27 AM  [facility] [address] [place], [place], [address] Phone: [phone]   Fax:  [phone]  Name: Melissa [name] MRN: [id number] Date of Birth: 1971/06/28

## 2019-11-11 DIAGNOSIS — Z03818 Encounter for observation for suspected exposure to other biological agents ruled out: Secondary | ICD-10-CM | POA: Diagnosis not present

## 2019-11-23 MED FILL — VALACYCLOVIR HCL 500 MG TAB: 500 | 15 days supply | Qty: 30 | Fill #0

## 2020-02-01 DIAGNOSIS — D225 Melanocytic nevi of trunk: Secondary | ICD-10-CM | POA: Diagnosis not present

## 2020-02-01 DIAGNOSIS — Z86018 Personal history of other benign neoplasm: Secondary | ICD-10-CM | POA: Diagnosis not present

## 2020-02-01 DIAGNOSIS — L918 Other hypertrophic disorders of the skin: Secondary | ICD-10-CM | POA: Diagnosis not present

## 2020-02-01 DIAGNOSIS — L578 Other skin changes due to chronic exposure to nonionizing radiation: Secondary | ICD-10-CM | POA: Diagnosis not present

## 2020-02-01 DIAGNOSIS — L814 Other melanin hyperpigmentation: Secondary | ICD-10-CM | POA: Diagnosis not present

## 2020-02-01 DIAGNOSIS — Z85820 Personal history of malignant melanoma of skin: Secondary | ICD-10-CM | POA: Diagnosis not present

## 2020-02-01 DIAGNOSIS — L821 Other seborrheic keratosis: Secondary | ICD-10-CM | POA: Diagnosis not present

## 2020-05-11 ENCOUNTER — Encounter: Payer: Self-pay | Admitting: Obstetrics & Gynecology

## 2020-05-11 ENCOUNTER — Other Ambulatory Visit: Payer: Self-pay | Admitting: Obstetrics & Gynecology

## 2020-05-11 ENCOUNTER — Other Ambulatory Visit: Payer: Self-pay

## 2020-05-11 ENCOUNTER — Ambulatory Visit (INDEPENDENT_AMBULATORY_CARE_PROVIDER_SITE_OTHER): Payer: 59 | Admitting: Obstetrics & Gynecology

## 2020-05-11 VITALS — BP 108/68 | HR 68 | Resp 16 | Ht 67.0 in | Wt 172.0 lb

## 2020-05-11 DIAGNOSIS — N912 Amenorrhea, unspecified: Secondary | ICD-10-CM | POA: Diagnosis not present

## 2020-05-11 DIAGNOSIS — Z0000 Encounter for general adult medical examination without abnormal findings: Secondary | ICD-10-CM | POA: Diagnosis not present

## 2020-05-11 DIAGNOSIS — Z30432 Encounter for removal of intrauterine contraceptive device: Secondary | ICD-10-CM

## 2020-05-11 DIAGNOSIS — Z01419 Encounter for gynecological examination (general) (routine) without abnormal findings: Secondary | ICD-10-CM

## 2020-05-11 DIAGNOSIS — Z1211 Encounter for screening for malignant neoplasm of colon: Secondary | ICD-10-CM

## 2020-05-11 MED ORDER — PAROXETINE HCL 10 MG PO TABS: 10.0000 mg | ORAL_TABLET | ORAL | 1 refills | Status: DC

## 2020-05-11 MED FILL — PARoxetine HCL 10 MG TABS: 10 | 30 days supply | Qty: 30 | Fill #0

## 2020-05-11 NOTE — Progress Notes (Signed)
49 y.o. G2P0 Married White or Caucasian female here for annual exam.  She is a new patient today.  She has a pargard IUD.  It's been present about 10 years.  She hasn't cycled in a little over a year.  She's having hot flashes, night sweats, increased anxiety, emotional lability.    No LMP recorded. (Menstrual status: IUD).          Sexually active: Yes.    The current method of family planning is IUD.    Exercising: Yes.    walking Smoker:  no  Health Maintenance: Pap:  2020 [facility] ([facility]) History of abnormal Pap:  no MMG:  02-11-2019 left breast category b density birads 1:neg (per patient she had bilateral done & request her records be sent to [place]) Colonoscopy:  Discussed today.  Will refer for this. BMD:   Done yrs ago TDaP:  Unsure  D/w pt getting update with exposure.  She declines today.   Pneumonia vaccine(s):  Not done Shingrix:   Not done Hep C testing: not done Screening Labs: never done   reports that she has never smoked. She has never used smokeless tobacco. She reports previous alcohol use. She reports that she does not use drugs.  Past Medical History:  Diagnosis Date  . Anxiety   . Cancer ([place])    melanoma (left leg behind knee) & basal cell (rt hip)  . Migraines   . STD (sexually transmitted disease)    HSV2    Past Surgical History:  Procedure Laterality Date  . BARTHOLIN GLAND CYST EXCISION    . CESAREAN SECTION     2 times  . paragard     inserted 06-18-12    Current Outpatient Medications  Medication Sig Dispense Refill  . Multiple Vitamins-Minerals (ZINC PO) Take by mouth.    . Probiotic Product (PROBIOTIC PO) Probiotic    . valACYclovir (VALTREX) 500 MG tablet Take 500 mg by mouth 2 (two) times daily as needed.    Marland Kitchen VITAMIN D PO Take 2,000 Int'l Units by mouth daily.     No current facility-administered medications for this visit.    History reviewed. No pertinent family history.  Review of Systems  Constitutional:  Negative.   HENT: Negative.   Eyes: Negative.   Respiratory: Negative.   Cardiovascular: Negative.   Gastrointestinal: Negative.   Endocrine: Negative.   Genitourinary:       Menopausal symptoms  Musculoskeletal: Negative.   Skin: Negative.   Allergic/Immunologic: Negative.   Neurological: Negative.   Hematological: Negative.   Psychiatric/Behavioral: Negative.     Exam:   BP 108/68   Pulse 68   Resp 16   Ht 5\' 7"  (1.702 m)   Wt 172 lb (78 kg)   BMI 26.94 kg/m   Height: 5\' 7"  (170.2 cm)  General appearance: alert, cooperative and appears stated age Head: Normocephalic, without obvious abnormality, atraumatic Neck: no adenopathy, supple, symmetrical, trachea midline and thyroid normal to inspection and palpation Lungs: clear to auscultation bilaterally Breasts: normal appearance, no masses or tenderness Heart: regular rate and rhythm Abdomen: soft, non-tender; bowel sounds normal; no masses,  no organomegaly Extremities: extremities normal, atraumatic, no cyanosis or edema Skin: Skin color, texture, turgor normal. No rashes or lesions Lymph nodes: Cervical, supraclavicular, and axillary nodes normal. No abnormal inguinal nodes palpated Neurologic: Grossly normal   Pelvic: External genitalia:  no lesions              Urethra:  normal appearing urethra with no masses, tenderness or lesions              Bartholins and Skenes: normal                 Vagina: normal appearing vagina with normal color and discharge, no lesions              Cervix: no lesions              Pap taken: Yes.   Bimanual Exam:  Uterus:  normal size, contour, position, consistency, mobility, non-tender              Adnexa: normal adnexa and no mass, fullness, tenderness               Rectovaginal: Confirms               Anus:  normal sphincter tone, no lesions  Procedure:  Pt requested IUD removal today.  Consent obtained.  Could not see IUD string after speculum was placed.  Cervix cleansed with  Betadine x 3.  Single toothed tenaculum applied to anterior lip of cervix.  Cervix dilated with milex dilator.  Using curette that was passed through the cervical os, string grasped with one pull and IUD removed easily.  Pt did cramp with this but this stopped almost immediately.  Tenaculum removed.  Minimal bleeding noted.  Pt tolerated procedure well.   Chaperone, Terence Lux, CMA, was present for exam.  A:  Well Woman with normal exam Amenorrhea Paragard IUD in place that she would like to have removed Probable vasomotor symptoms H/o HSV 2  P:   Mammogram guidelines reviewed.  Will do these at the breast center going forward pap smear obtained today.  Release of records will be signed. FSH, TSH and prolactin obtained CMP, CBC and lipids obtained IUD removal completed today. Will try paxil 10mg  daily for vasomotor symptoms. Side effects/risks discussed.  She will call with update in 2-3 weeks. Colonoscopy referral placed. return annually or prn

## 2020-05-12 LAB — COMPREHENSIVE METABOLIC PANEL
ALT: 23 IU/L (ref 0–32)
AST: 21 IU/L (ref 0–40)
Albumin/Globulin Ratio: 2 (ref 1.2–2.2)
Albumin: 4.9 g/dL — ABNORMAL HIGH (ref 3.8–4.8)
Alkaline Phosphatase: 64 IU/L (ref 44–121)
BUN/Creatinine Ratio: 16 (ref 9–23)
BUN: 13 mg/dL (ref 6–24)
Bilirubin Total: 0.4 mg/dL (ref 0.0–1.2)
CO2: 23 mmol/L (ref 20–29)
Calcium: 10.2 mg/dL (ref 8.7–10.2)
Chloride: 100 mmol/L (ref 96–106)
Creatinine, Ser: 0.8 mg/dL (ref 0.57–1.00)
GFR calc Af Amer: 101 mL/min/{1.73_m2} (ref >59)
GFR calc non Af Amer: 87 mL/min/{1.73_m2} (ref >59)
Globulin, Total: 2.5 g/dL (ref 1.5–4.5)
Glucose: 85 mg/dL (ref 65–99)
Potassium: 4.5 mmol/L (ref 3.5–5.2)
Sodium: 141 mmol/L (ref 134–144)
Total Protein: 7.4 g/dL (ref 6.0–8.5)

## 2020-05-12 LAB — LIPID PANEL
Chol/HDL Ratio: 3.9 ratio (ref 0.0–4.4)
Cholesterol, Total: 166 mg/dL (ref 100–199)
HDL: 43 mg/dL (ref >39)
LDL Chol Calc (NIH): 102 mg/dL — ABNORMAL HIGH (ref 0–99)
Triglycerides: 118 mg/dL (ref 0–149)
VLDL Cholesterol Cal: 21 mg/dL (ref 5–40)

## 2020-05-12 LAB — CBC
Hematocrit: 40.4 % (ref 34.0–46.6)
Hemoglobin: 13.1 g/dL (ref 11.1–15.9)
MCH: 27.8 pg (ref 26.6–33.0)
MCHC: 32.4 g/dL (ref 31.5–35.7)
MCV: 86 fL (ref 79–97)
Platelets: 336 10*3/uL (ref 150–450)
RBC: 4.71 x10E6/uL (ref 3.77–5.28)
RDW: 13.1 % (ref 11.7–15.4)
WBC: 6 10*3/uL (ref 3.4–10.8)

## 2020-05-12 LAB — FOLLICLE STIMULATING HORMONE: FSH: 68.9 m[IU]/mL

## 2020-05-12 LAB — TSH: TSH: 1.93 u[IU]/mL (ref 0.450–4.500)

## 2020-05-12 LAB — PROLACTIN: Prolactin: 10.3 ng/mL (ref 4.8–23.3)

## 2020-05-22 ENCOUNTER — Other Ambulatory Visit (HOSPITAL_COMMUNITY)
Admission: RE | Admit: 2020-05-22 | Discharge: 2020-05-22 | Disposition: A | Discharge status: Home / Self Care | Payer: 59 | Source: Ambulatory Visit | Attending: Obstetrics & Gynecology | Admitting: Obstetrics & Gynecology

## 2020-05-22 ENCOUNTER — Telehealth: Payer: Self-pay

## 2020-05-22 ENCOUNTER — Other Ambulatory Visit: Payer: Self-pay | Admitting: Obstetrics & Gynecology

## 2020-05-22 DIAGNOSIS — Z124 Encounter for screening for malignant neoplasm of cervix: Secondary | ICD-10-CM | POA: Insufficient documentation

## 2020-05-22 DIAGNOSIS — R87610 Atypical squamous cells of undetermined significance on cytologic smear of cervix (ASC-US): Secondary | ICD-10-CM | POA: Diagnosis not present

## 2020-05-22 NOTE — Progress Notes (Signed)
Pap order placed.

## 2020-05-22 NOTE — Telephone Encounter (Signed)
Patients last pap was 12/18 & HPV HR was not done. We sent her pap out today for pap HPV testing.  Left message for callback to notify patient.

## 2020-05-22 NOTE — Telephone Encounter (Signed)
Patient notified

## 2020-05-24 LAB — CYTOLOGY - PAP
Comment: NEGATIVE
Diagnosis: UNDETERMINED — AB
High risk HPV: NEGATIVE

## 2020-06-01 ENCOUNTER — Other Ambulatory Visit (HOSPITAL_COMMUNITY): Payer: Self-pay

## 2020-06-02 MED FILL — TRIAMCINOLONE 0.025% CREAM: 0.025 | 7 days supply | Qty: 15 | Fill #0

## 2020-06-02 MED FILL — predniSONE 20 MG TABS: 20 | 5 days supply | Qty: 5 | Fill #0

## 2020-06-13 DIAGNOSIS — H524 Presbyopia: Secondary | ICD-10-CM | POA: Diagnosis not present

## 2020-06-20 ENCOUNTER — Encounter: Payer: Self-pay | Admitting: Gastroenterology

## 2020-06-20 MED FILL — PARoxetine HCL 10 MG TABS: 10 | 30 days supply | Qty: 30 | Fill #1

## 2020-07-27 ENCOUNTER — Other Ambulatory Visit: Payer: Self-pay | Admitting: Obstetrics & Gynecology

## 2020-07-27 MED ORDER — PAROXETINE HCL 10 MG PO TABS: 10.0000 mg | ORAL_TABLET | Freq: Every morning | ORAL | 2 refills | Status: DC

## 2020-07-27 MED FILL — PARoxetine HCL 10 MG TABS: 10 | 90 days supply | Qty: 90 | Fill #0

## 2020-08-08 ENCOUNTER — Other Ambulatory Visit: Payer: Self-pay | Admitting: Gastroenterology

## 2020-08-08 ENCOUNTER — Other Ambulatory Visit: Payer: Self-pay

## 2020-08-08 ENCOUNTER — Ambulatory Visit (AMBULATORY_SURGERY_CENTER): Payer: Self-pay | Admitting: *Deleted

## 2020-08-08 VITALS — Ht 67.0 in | Wt 163.0 lb

## 2020-08-08 DIAGNOSIS — Z1211 Encounter for screening for malignant neoplasm of colon: Secondary | ICD-10-CM

## 2020-08-08 MED ORDER — NA SULFATE-K SULFATE-MG SULF 17.5-3.13-1.6 GM/177ML PO SOLN: 1.0000 | Freq: Once | ORAL | 0 refills | Status: DC

## 2020-08-08 MED FILL — SUPREP BOWEL PREP KIT: 17.5-3.13-1 | 1 days supply | Qty: 354 | Fill #0

## 2020-08-08 NOTE — Progress Notes (Signed)

## 2020-08-16 ENCOUNTER — Other Ambulatory Visit: Payer: Self-pay | Admitting: Obstetrics & Gynecology

## 2020-08-16 ENCOUNTER — Encounter: Payer: Self-pay | Admitting: Gastroenterology

## 2020-08-16 DIAGNOSIS — Z1231 Encounter for screening mammogram for malignant neoplasm of breast: Secondary | ICD-10-CM

## 2020-08-22 ENCOUNTER — Encounter: Payer: 59 | Admitting: Gastroenterology

## 2020-08-28 ENCOUNTER — Other Ambulatory Visit: Payer: Self-pay

## 2020-08-28 ENCOUNTER — Ambulatory Visit
Admission: RE | Admit: 2020-08-28 | Discharge: 2020-08-28 | Disposition: A | Discharge status: Home / Self Care | Payer: 59 | Source: Ambulatory Visit

## 2020-08-28 DIAGNOSIS — Z1231 Encounter for screening mammogram for malignant neoplasm of breast: Secondary | ICD-10-CM | POA: Diagnosis not present

## 2020-10-18 ENCOUNTER — Ambulatory Visit (AMBULATORY_SURGERY_CENTER): Payer: 59 | Admitting: Gastroenterology

## 2020-10-18 ENCOUNTER — Other Ambulatory Visit: Payer: Self-pay

## 2020-10-18 ENCOUNTER — Encounter: Payer: Self-pay | Admitting: Gastroenterology

## 2020-10-18 VITALS — BP 102/65 | HR 63 | Temp 98.0°F | Resp 16 | Ht 67.0 in | Wt 163.0 lb

## 2020-10-18 DIAGNOSIS — Z1211 Encounter for screening for malignant neoplasm of colon: Secondary | ICD-10-CM | POA: Diagnosis not present

## 2020-10-18 MED ORDER — SODIUM CHLORIDE 0.9 % IV SOLN: 500.0000 mL | Freq: Once | INTRAVENOUS | Status: DC

## 2020-10-18 NOTE — Patient Instructions (Signed)
YOU HAD AN ENDOSCOPIC PROCEDURE TODAY AT THE [facility]:   Refer to the procedure report that was given to you for any specific questions about what was found during the examination.  If the procedure report does not answer your questions, please call your gastroenterologist to clarify.  If you requested that your care partner not be given the details of your procedure findings, then the procedure report has been included in a sealed envelope for you to review at your convenience later.  YOU SHOULD EXPECT: Some feelings of bloating in the abdomen. Passage of more gas than usual.  Walking can help get rid of the air that was put into your GI tract during the procedure and reduce the bloating. If you had a lower endoscopy (such as a colonoscopy or flexible sigmoidoscopy) you may notice spotting of blood in your stool or on the toilet paper. If you underwent a bowel prep for your procedure, you may not have a normal bowel movement for a few days.  Please Note:  You might notice some irritation and congestion in your nose or some drainage.  This is from the oxygen used during your procedure.  There is no need for concern and it should clear up in a day or so.  SYMPTOMS TO REPORT IMMEDIATELY:   Following lower endoscopy (colonoscopy or flexible sigmoidoscopy):  Excessive amounts of blood in the stool  Significant tenderness or worsening of abdominal pains  Swelling of the abdomen that is new, acute  Fever of 100F or higher   Following upper endoscopy (EGD)  Vomiting of blood or coffee ground material  New chest pain or pain under the shoulder blades  Painful or persistently difficult swallowing  New shortness of breath  Fever of 100F or higher  Black, tarry-looking stools  For urgent or emergent issues, a gastroenterologist can be reached at any hour by calling [phone]. Do not use MyChart messaging for urgent concerns.    DIET:  We do recommend a small meal at first, but  then you may proceed to your regular diet.  Drink plenty of fluids but you should avoid alcoholic beverages for 24 hours.  ACTIVITY:  You should plan to take it easy for the rest of today and you should NOT DRIVE or use heavy machinery until tomorrow (because of the sedation medicines used during the test).    FOLLOW UP: Our staff will call the number listed on your records 48-72 hours following your procedure to check on you and address any questions or concerns that you may have regarding the information given to you following your procedure. If we do not reach you, we will leave a message.  We will attempt to reach you two times.  During this call, we will ask if you have developed any symptoms of COVID 19. If you develop any symptoms (ie: fever, flu-like symptoms, shortness of breath, cough etc.) before then, please call [phone].  If you test positive for Covid 19 in the 2 weeks post procedure, please call and report this information to us.    If any biopsies were taken you will be contacted by phone or by letter within the next 1-3 weeks.  Please call us at [phone] if you have not heard about the biopsies in 3 weeks.    SIGNATURES/CONFIDENTIALITY: You and/or your care partner have signed paperwork which will be entered into your electronic medical record.  These signatures attest to the fact that that the information above on   your After Visit Summary has been reviewed and is understood.  Full responsibility of the confidentiality of this discharge information lies with you and/or your care-partner. 

## 2020-10-18 NOTE — Progress Notes (Addendum)
AR - Check-in  CW - VS  Pt's states no medical or surgical changes since previsit or office visit.  Changed from Dr. Lyndel Safe to Dr. Silverio Decamp.

## 2020-10-18 NOTE — Op Note (Signed)
[place] Patient Name: Melissa [name] Procedure Date: 10/18/2020 9:23 AM MRN: [id number] Endoscopist: Mauri Pole , MD Age: 50 Referring MD:  Date of Birth: May 12, 1971 Gender: Female Account #: [id number] Procedure:                Colonoscopy Indications:              Screening for colorectal malignant neoplasm Medicines:                Monitored Anesthesia Care Procedure:                Pre-Anesthesia Assessment:                           - Prior to the procedure, a History and Physical                            was performed, and patient medications and                            allergies were reviewed. The patient's tolerance of                            previous anesthesia was also reviewed. The risks                            and benefits of the procedure and the sedation                            options and risks were discussed with the patient.                            All questions were answered, and informed consent                            was obtained. ASA Grade Assessment: II - A patient                            with mild systemic disease. After reviewing the                            risks and benefits, the patient was deemed in                            satisfactory condition to undergo the procedure.                           After obtaining informed consent, the colonoscope                            was passed under direct vision. Throughout the                            procedure, the patient's blood pressure, pulse, and  oxygen saturations were monitored continuously. The                            Olympus PCF-H190DL (#[id number]) Colonoscope was                            introduced through the anus and advanced to the the                            cecum, identified by appendiceal orifice and                            ileocecal valve. The quality of the bowel                            preparation was  excellent. The ileocecal valve,                            appendiceal orifice, and rectum were photographed. Scope In: 9:28:44 AM Scope Out: 9:45:58 AM Scope Withdrawal Time: 0 hours 12 minutes 37 seconds  Total Procedure Duration: 0 hours 17 minutes 14 seconds  Findings:                 The perianal and digital rectal examinations were                            normal.                           A few small-mouthed diverticula were found in the                            sigmoid colon.                           Non-bleeding external and internal hemorrhoids were                            found during retroflexion. The hemorrhoids were                            small. Complications:            No immediate complications. Estimated Blood Loss:     Estimated blood loss was minimal. Impression:               - Diverticulosis in the sigmoid colon.                           - Non-bleeding external and internal hemorrhoids.                           - No specimens collected. Recommendation:           - Patient has a contact number available for  emergencies. The signs and symptoms of potential                            delayed complications were discussed with the                            patient. Return to normal activities tomorrow.                            Written discharge instructions were provided to the                            patient.                           - Resume previous diet.                           - Continue present medications.                           - Await pathology results.                           - Repeat colonoscopy in 10 years for surveillance                            based on pathology results. Mauri Pole, MD 10/18/2020 9:56:26 AM This report has been signed electronically.

## 2020-10-18 NOTE — Progress Notes (Signed)
Pt Drowsy. VSS. To PACU, report to RN. No anesthetic complications noted.  

## 2020-10-20 ENCOUNTER — Telehealth: Payer: Self-pay

## 2020-10-20 NOTE — Telephone Encounter (Signed)
  Follow up Call-  Call back number 10/18/2020  Post procedure Call Back phone  # [phone] cell  Permission to leave phone message Yes  Some recent data might be hidden     Patient questions:  Do you have a fever, pain , or abdominal swelling? No. Pain Score  0 *  Have you tolerated food without any problems? Yes.    Have you been able to return to your normal activities? Yes.    Do you have any questions about your discharge instructions: Diet   No. Medications  No. Follow up visit  No.  Do you have questions or concerns about your Care? No.  Actions: * If pain score is 4 or above: No action needed, pain <4.

## 2020-11-17 ENCOUNTER — Other Ambulatory Visit (HOSPITAL_COMMUNITY): Payer: Self-pay

## 2020-11-17 MED FILL — Paroxetine HCl Tab 10 MG: ORAL | 90 days supply | Qty: 90 | Fill #0 | Status: AC

## 2020-11-21 ENCOUNTER — Other Ambulatory Visit (HOSPITAL_COMMUNITY): Payer: Self-pay

## 2020-11-21 ENCOUNTER — Other Ambulatory Visit (HOSPITAL_BASED_OUTPATIENT_CLINIC_OR_DEPARTMENT_OTHER): Payer: Self-pay | Admitting: Obstetrics & Gynecology

## 2020-11-21 MED ORDER — VALACYCLOVIR HCL 500 MG PO TABS
ORAL_TABLET | ORAL | 1 refills | Status: DC
  Filled 2020-11-21: qty 6, 3d supply, fill #0
  Filled 2021-04-23: qty 6, 3d supply, fill #1

## 2021-02-01 DIAGNOSIS — Z85820 Personal history of malignant melanoma of skin: Secondary | ICD-10-CM | POA: Diagnosis not present

## 2021-02-01 DIAGNOSIS — L578 Other skin changes due to chronic exposure to nonionizing radiation: Secondary | ICD-10-CM | POA: Diagnosis not present

## 2021-02-01 DIAGNOSIS — Z86018 Personal history of other benign neoplasm: Secondary | ICD-10-CM | POA: Diagnosis not present

## 2021-02-01 DIAGNOSIS — D225 Melanocytic nevi of trunk: Secondary | ICD-10-CM | POA: Diagnosis not present

## 2021-02-01 DIAGNOSIS — L814 Other melanin hyperpigmentation: Secondary | ICD-10-CM | POA: Diagnosis not present

## 2021-02-01 DIAGNOSIS — L918 Other hypertrophic disorders of the skin: Secondary | ICD-10-CM | POA: Diagnosis not present

## 2021-02-01 DIAGNOSIS — L719 Rosacea, unspecified: Secondary | ICD-10-CM | POA: Diagnosis not present

## 2021-02-01 DIAGNOSIS — D485 Neoplasm of uncertain behavior of skin: Secondary | ICD-10-CM | POA: Diagnosis not present

## 2021-02-01 DIAGNOSIS — L821 Other seborrheic keratosis: Secondary | ICD-10-CM | POA: Diagnosis not present

## 2021-02-22 ENCOUNTER — Other Ambulatory Visit (HOSPITAL_COMMUNITY): Payer: Self-pay

## 2021-02-22 MED FILL — Paroxetine HCl Tab 10 MG: ORAL | 90 days supply | Qty: 90 | Fill #1 | Status: AC

## 2021-04-23 ENCOUNTER — Other Ambulatory Visit (HOSPITAL_COMMUNITY): Payer: Self-pay

## 2021-04-28 ENCOUNTER — Other Ambulatory Visit (HOSPITAL_COMMUNITY): Payer: Self-pay

## 2021-06-11 ENCOUNTER — Other Ambulatory Visit: Payer: Self-pay | Admitting: Obstetrics & Gynecology

## 2021-06-12 ENCOUNTER — Other Ambulatory Visit (HOSPITAL_COMMUNITY): Payer: Self-pay

## 2021-06-12 MED ORDER — PAROXETINE HCL 10 MG PO TABS
ORAL_TABLET | Freq: Every morning | ORAL | 0 refills | Status: DC
  Filled 2021-06-12: qty 90, 90d supply, fill #0

## 2021-06-13 ENCOUNTER — Other Ambulatory Visit: Payer: Self-pay | Admitting: Obstetrics & Gynecology

## 2021-06-13 DIAGNOSIS — Z1231 Encounter for screening mammogram for malignant neoplasm of breast: Secondary | ICD-10-CM

## 2021-07-24 ENCOUNTER — Ambulatory Visit (HOSPITAL_BASED_OUTPATIENT_CLINIC_OR_DEPARTMENT_OTHER): Payer: 59 | Admitting: Obstetrics & Gynecology

## 2021-07-25 ENCOUNTER — Ambulatory Visit (HOSPITAL_BASED_OUTPATIENT_CLINIC_OR_DEPARTMENT_OTHER): Payer: 59 | Admitting: Obstetrics & Gynecology

## 2021-08-16 ENCOUNTER — Other Ambulatory Visit (HOSPITAL_COMMUNITY): Payer: Self-pay

## 2021-08-16 ENCOUNTER — Ambulatory Visit (INDEPENDENT_AMBULATORY_CARE_PROVIDER_SITE_OTHER): Payer: 59 | Admitting: Obstetrics & Gynecology

## 2021-08-16 ENCOUNTER — Other Ambulatory Visit: Payer: Self-pay

## 2021-08-16 ENCOUNTER — Encounter (HOSPITAL_BASED_OUTPATIENT_CLINIC_OR_DEPARTMENT_OTHER): Payer: Self-pay | Admitting: Obstetrics & Gynecology

## 2021-08-16 VITALS — BP 123/85 | HR 79 | Ht 67.0 in | Wt 182.8 lb

## 2021-08-16 DIAGNOSIS — Z85820 Personal history of malignant melanoma of skin: Secondary | ICD-10-CM | POA: Insufficient documentation

## 2021-08-16 DIAGNOSIS — Z780 Asymptomatic menopausal state: Secondary | ICD-10-CM

## 2021-08-16 DIAGNOSIS — B009 Herpesviral infection, unspecified: Secondary | ICD-10-CM

## 2021-08-16 DIAGNOSIS — Z01419 Encounter for gynecological examination (general) (routine) without abnormal findings: Secondary | ICD-10-CM

## 2021-08-16 DIAGNOSIS — N951 Menopausal and female climacteric states: Secondary | ICD-10-CM | POA: Diagnosis not present

## 2021-08-16 MED ORDER — PAROXETINE HCL 10 MG PO TABS
ORAL_TABLET | Freq: Every morning | ORAL | 4 refills | Status: DC
  Filled 2021-08-16: qty 90, fill #0
  Filled 2021-09-24: qty 90, 90d supply, fill #0
  Filled 2022-01-01: qty 90, 90d supply, fill #1
  Filled 2022-04-18: qty 90, 90d supply, fill #2
  Filled 2022-07-27: qty 90, 90d supply, fill #3

## 2021-08-16 MED ORDER — VALACYCLOVIR HCL 500 MG PO TABS
ORAL_TABLET | ORAL | 1 refills | Status: DC
  Filled 2021-08-16 – 2021-10-08 (×2): qty 6, 3d supply, fill #0
  Filled 2022-02-26: qty 6, 3d supply, fill #1

## 2021-08-16 NOTE — Progress Notes (Signed)
51 y.o. G2P0 Married White or Caucasian female here for annual exam.  Denies vaginal bleeding.  Melissa [name] was 68 last year.  IUD was removed.  Does feel "warmer" than in the past but doesn't really have a lot of hot flashes.  Sleep is pretty good.    Is on 10mg  Paxil.  She feels really good with this dosage.  Is comfortable where she is and does need RF.  No LMP recorded. Patient is postmenopausal.          Sexually active: Yes.    The current method of family planning is post menopausal status.    Exercising: not right now Smoker:  no  Health Maintenance: Pap:  05/22/2020 ASCUS, neg HR HPV (pap smear reviewed and questions answered) History of abnormal Pap:  no MMG:  08/28/2020 Negative, scheduled 08/31/2021 Colonoscopy:  10/18/2020, follow up 10 years BMD:   n/a Screening Labs: 05/2020   reports that she has never smoked. She has never used smokeless tobacco. She reports that she does not currently use alcohol. She reports that she does not use drugs.  Past Medical History:  Diagnosis Date   Anxiety    Bell's palsy    Cancer ([place])    melanoma (left leg behind knee) & basal cell (rt hip)   Migraines    STD (sexually transmitted disease)    HSV2    Past Surgical History:  Procedure Laterality Date   BARTHOLIN GLAND CYST EXCISION     BREAST BIOPSY Right 11/08/2011   PASH   CESAREAN SECTION     2 times    Current Outpatient Medications  Medication Sig Dispense Refill   Multiple Vitamins-Minerals (ZINC PO) Take by mouth.     Probiotic Product (PROBIOTIC PO) Probiotic     VITAMIN D PO Take 2,000 Int'l Units by mouth daily.     PARoxetine (PAXIL) 10 MG tablet TAKE 1 TABLET BY MOUTH EVERY MORNING. 90 tablet 4   valACYclovir (VALTREX) 500 MG tablet Take 1 tablet twice daily for 3 days with symptom onset. 6 tablet 1   No current facility-administered medications for this visit.    Family History  Problem Relation Age of Onset   Colon polyps Neg Hx    Colon cancer Neg Hx     Esophageal cancer Neg Hx    Stomach cancer Neg Hx    Rectal cancer Neg Hx     Review of Systems  All other systems reviewed and are negative.  Exam:   BP 123/85 (BP Location: Left Arm, Patient Position: Sitting, Cuff Size: Large)    Pulse 79    Ht 5\' 7"  (1.702 m) Comment: reported   Wt 182 lb 12.8 oz (82.9 kg)    BMI 28.63 kg/m   Height: 5\' 7"  (170.2 cm) (reported)  General appearance: alert, cooperative and appears stated age Head: Normocephalic, without obvious abnormality, atraumatic Neck: no adenopathy, supple, symmetrical, trachea midline and thyroid normal to inspection and palpation Lungs: clear to auscultation bilaterally Breasts: normal appearance, no masses or tenderness Heart: regular rate and rhythm Abdomen: soft, non-tender; bowel sounds normal; no masses,  no organomegaly Extremities: extremities normal, atraumatic, no cyanosis or edema Skin: Skin color, texture, turgor normal. No rashes or lesions Lymph nodes: Cervical, supraclavicular, and axillary nodes normal. No abnormal inguinal nodes palpated Neurologic: Grossly normal   Pelvic: External genitalia:  no lesions              Urethra:  normal appearing urethra with no masses, tenderness  or lesions              Bartholins and Skenes: normal                 Vagina: normal appearing vagina with normal color and no discharge, no lesions              Cervix: no lesions              Pap taken: No. Bimanual Exam:  Uterus:  normal size, contour, position, consistency, mobility, non-tender              Adnexa: normal adnexa and no mass, fullness, tenderness               Rectovaginal: Confirms               Anus:  normal sphincter tone, no lesions  Chaperone, Octaviano Batty, CMA, was present for exam.  Assessment/Plan: 1. Well woman exam with routine gynecological exam - ASCUS with neg HR HPV pap 2021.  Repeat 2024. - MMG 08/28/2020 - colonoscopy 10/2020, follow up 10 years - lab work done 05/2020 - vaccines  reviewed/updated  2. Vasomotor symptoms due to menopause - on Paxil and doing well.  RF done today. - PARoxetine (PAXIL) 10 MG tablet; TAKE 1 TABLET BY MOUTH EVERY MORNING.  Dispense: 90 tablet; Refill: 4  3. Postmenopausal - no HRT  4. HSV-2 (herpes simplex virus 2) infection - uses valtrex rarely - valACYclovir (VALTREX) 500 MG tablet; Take 1 tablet twice daily for 3 days at onset of symptoms.  Dispense: 6 tablet; Refill: 1

## 2021-08-19 DIAGNOSIS — B009 Herpesviral infection, unspecified: Secondary | ICD-10-CM | POA: Insufficient documentation

## 2021-08-19 DIAGNOSIS — N951 Menopausal and female climacteric states: Secondary | ICD-10-CM | POA: Insufficient documentation

## 2021-08-31 ENCOUNTER — Ambulatory Visit
Admission: RE | Admit: 2021-08-31 | Discharge: 2021-08-31 | Disposition: A | Discharge status: Home / Self Care | Payer: 59 | Source: Ambulatory Visit

## 2021-08-31 DIAGNOSIS — Z1231 Encounter for screening mammogram for malignant neoplasm of breast: Secondary | ICD-10-CM

## 2021-09-03 ENCOUNTER — Other Ambulatory Visit: Payer: Self-pay | Admitting: Obstetrics & Gynecology

## 2021-09-03 DIAGNOSIS — R928 Other abnormal and inconclusive findings on diagnostic imaging of breast: Secondary | ICD-10-CM

## 2021-09-20 ENCOUNTER — Ambulatory Visit
Admission: RE | Admit: 2021-09-20 | Discharge: 2021-09-20 | Disposition: A | Discharge status: Home / Self Care | Payer: 59 | Source: Ambulatory Visit | Attending: Obstetrics & Gynecology | Admitting: Obstetrics & Gynecology

## 2021-09-20 DIAGNOSIS — R928 Other abnormal and inconclusive findings on diagnostic imaging of breast: Secondary | ICD-10-CM

## 2021-09-20 DIAGNOSIS — R922 Inconclusive mammogram: Secondary | ICD-10-CM | POA: Diagnosis not present

## 2021-09-20 DIAGNOSIS — N6011 Diffuse cystic mastopathy of right breast: Secondary | ICD-10-CM | POA: Diagnosis not present

## 2021-09-24 ENCOUNTER — Other Ambulatory Visit (HOSPITAL_COMMUNITY): Payer: Self-pay

## 2021-09-25 ENCOUNTER — Other Ambulatory Visit (HOSPITAL_COMMUNITY): Payer: Self-pay

## 2021-10-08 ENCOUNTER — Other Ambulatory Visit (HOSPITAL_COMMUNITY): Payer: Self-pay

## 2021-11-25 DIAGNOSIS — S93491A Sprain of other ligament of right ankle, initial encounter: Secondary | ICD-10-CM | POA: Diagnosis not present

## 2022-01-01 ENCOUNTER — Other Ambulatory Visit (HOSPITAL_COMMUNITY): Payer: Self-pay

## 2022-01-02 ENCOUNTER — Other Ambulatory Visit (HOSPITAL_COMMUNITY): Payer: Self-pay

## 2022-02-25 DIAGNOSIS — L814 Other melanin hyperpigmentation: Secondary | ICD-10-CM | POA: Diagnosis not present

## 2022-02-25 DIAGNOSIS — D2239 Melanocytic nevi of other parts of face: Secondary | ICD-10-CM | POA: Diagnosis not present

## 2022-02-25 DIAGNOSIS — D225 Melanocytic nevi of trunk: Secondary | ICD-10-CM | POA: Diagnosis not present

## 2022-02-25 DIAGNOSIS — L578 Other skin changes due to chronic exposure to nonionizing radiation: Secondary | ICD-10-CM | POA: Diagnosis not present

## 2022-02-25 DIAGNOSIS — Z85820 Personal history of malignant melanoma of skin: Secondary | ICD-10-CM | POA: Diagnosis not present

## 2022-02-25 DIAGNOSIS — L821 Other seborrheic keratosis: Secondary | ICD-10-CM | POA: Diagnosis not present

## 2022-02-25 DIAGNOSIS — Z86018 Personal history of other benign neoplasm: Secondary | ICD-10-CM | POA: Diagnosis not present

## 2022-02-26 ENCOUNTER — Other Ambulatory Visit (HOSPITAL_COMMUNITY): Payer: Self-pay

## 2022-03-26 ENCOUNTER — Other Ambulatory Visit (HOSPITAL_BASED_OUTPATIENT_CLINIC_OR_DEPARTMENT_OTHER): Payer: Self-pay | Admitting: Obstetrics & Gynecology

## 2022-03-26 DIAGNOSIS — B009 Herpesviral infection, unspecified: Secondary | ICD-10-CM

## 2022-03-26 IMAGING — MG MM DIGITAL DIAGNOSTIC UNILAT*R* W/ TOMO W/ CAD
4 series · 4 of 12 positions shown · non-contrast
Comparison: Previous exam(s).

CLINICAL DATA: Patient recalled from screening for right breast
mass.

EXAM:
DIGITAL DIAGNOSTIC UNILATERAL RIGHT MAMMOGRAM WITH TOMOSYNTHESIS AND
CAD; ULTRASOUND RIGHT BREAST LIMITED
TECHNIQUE: Right digital diagnostic mammography and breast tomosynthesis was
performed. The images were evaluated with computer-aided detection.;
Targeted ultrasound examination of the right breast was performed

[R MLO synth-2D]
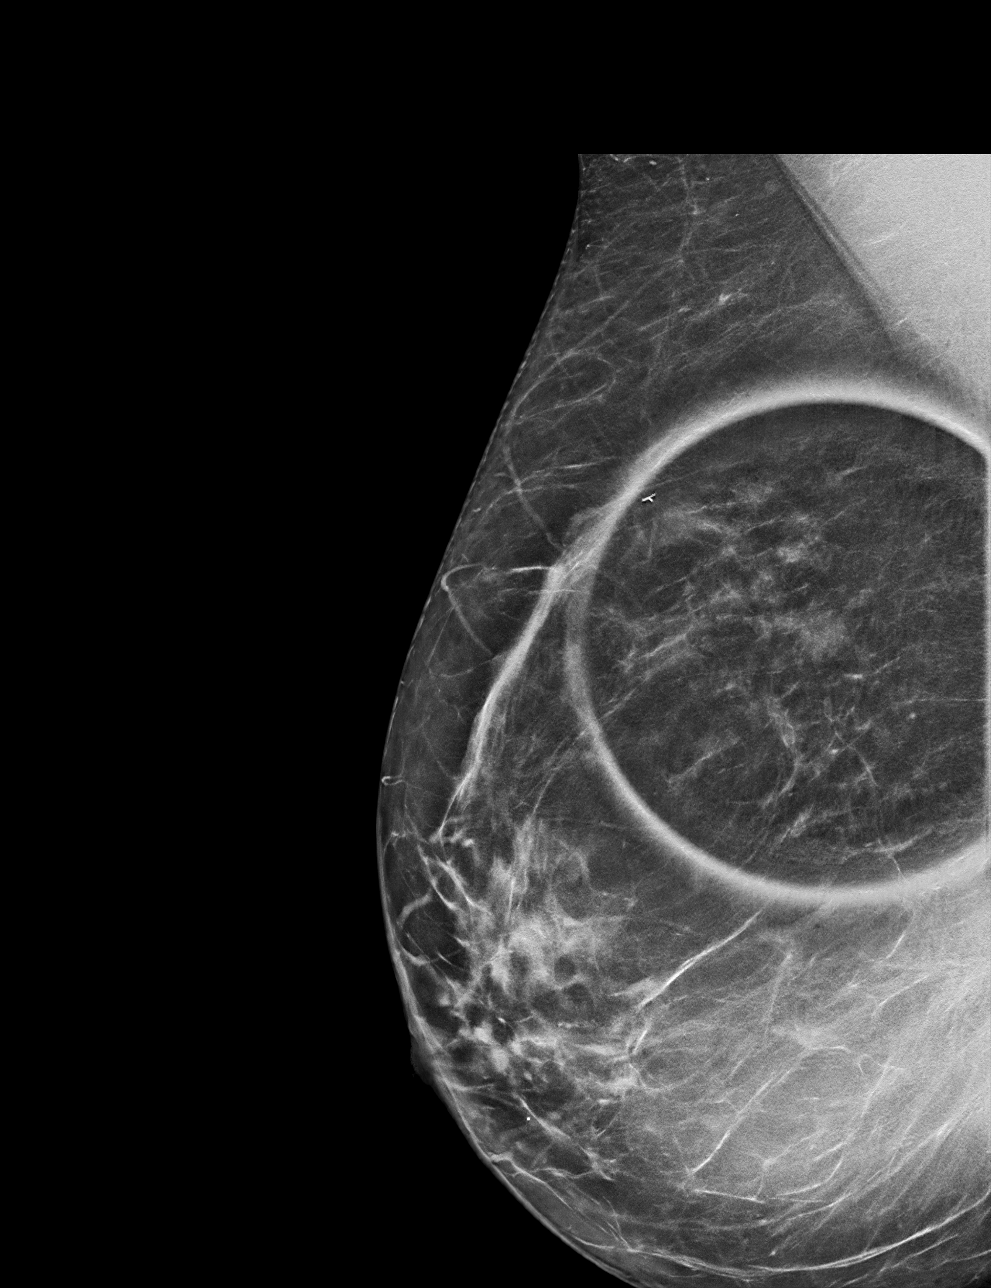

[R CC synth-2D]
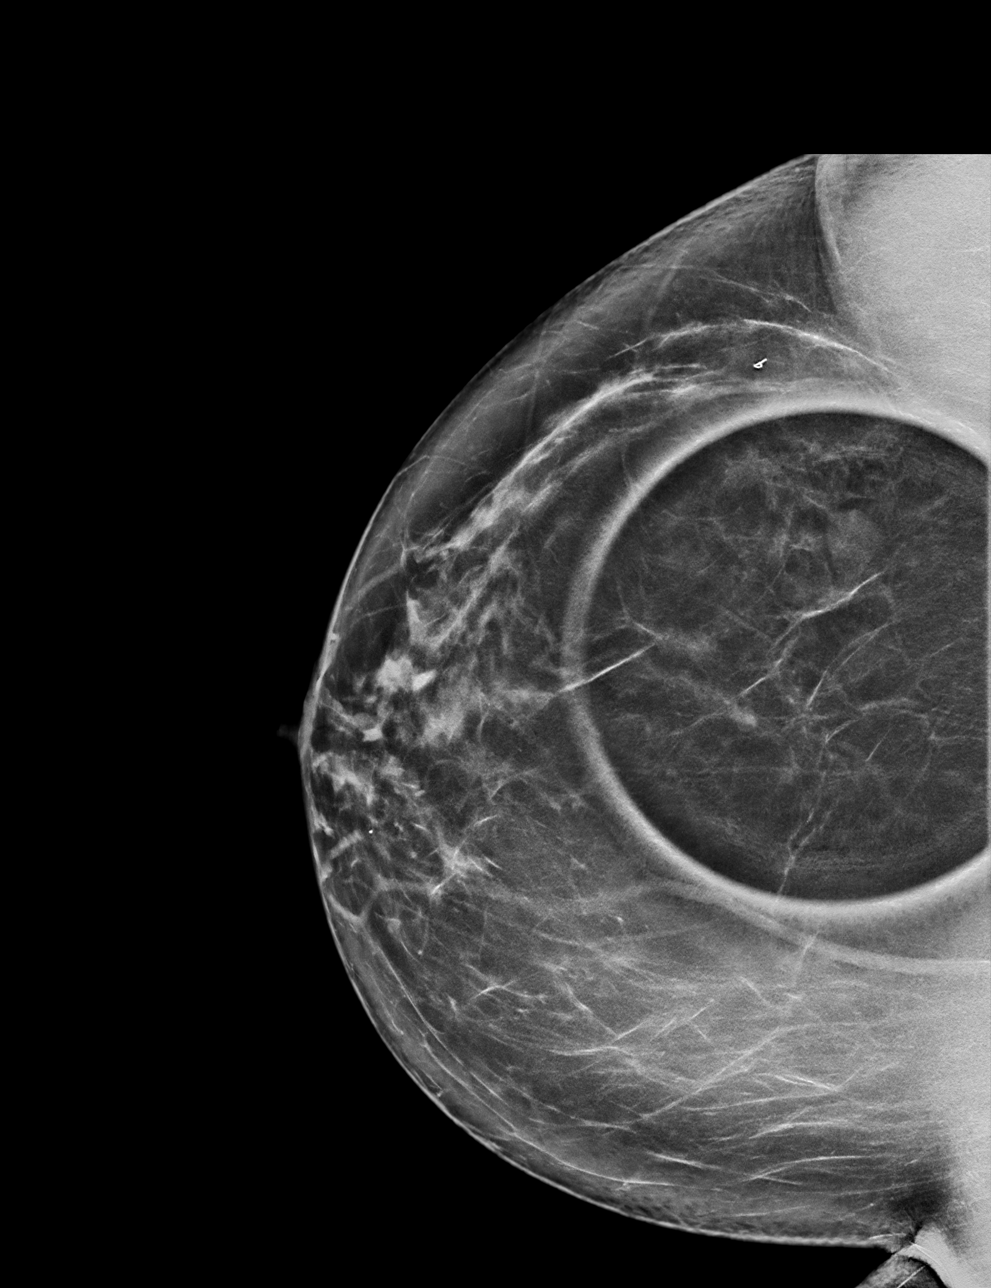

[R CC tomo · tomo slice 41/80.0]
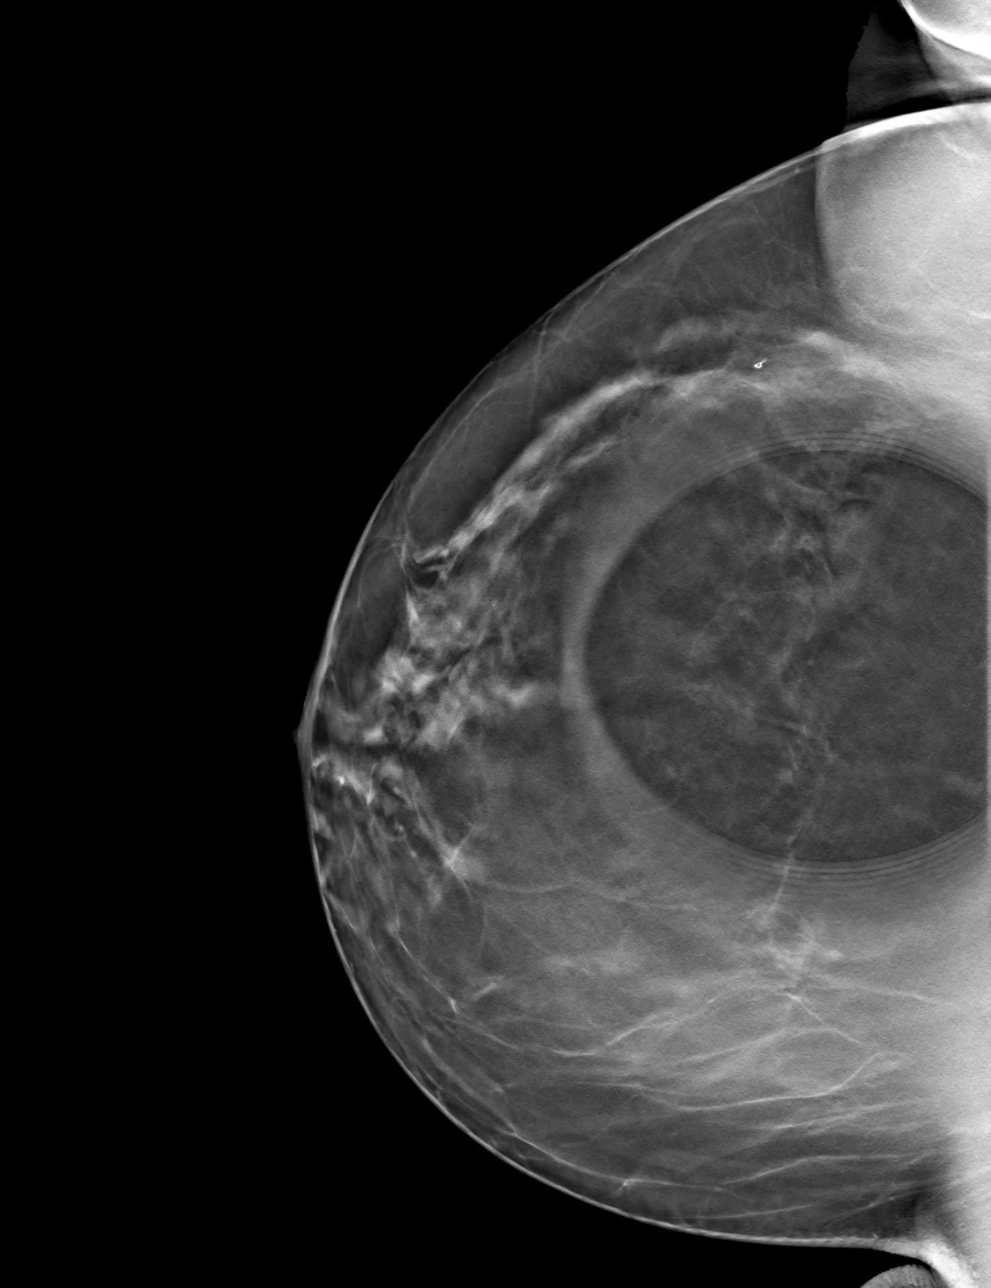

[R MLO tomo · tomo slice 37/74.0]
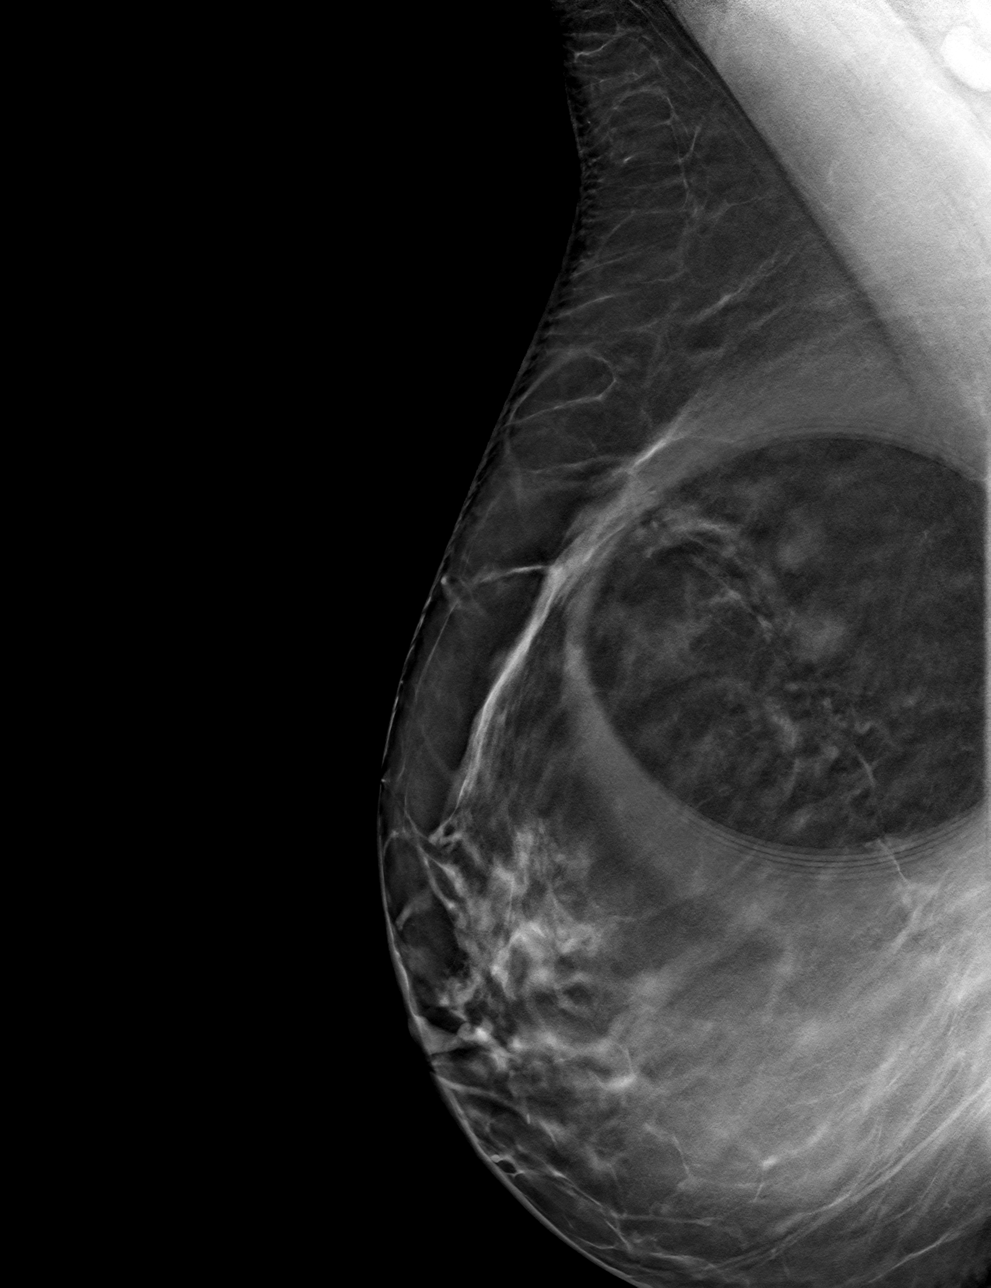

[4 of 12 positions shown; findings below may reference images not displayed]

ACR Breast Density Category c: The breast tissue is heterogeneously
dense, which may obscure small masses.
FINDINGS: There is a persistent oval mass within the upper-outer right breast
posterior depth.

On physical exam, no discrete mass is palpated upper-outer right
breast.

Targeted ultrasound is performed, showing a 4 x 3 x 7 mm cluster of
cysts right breast 9:30 o'clock 6 cm from the nipple.
IMPRESSION: Right breast cluster of cysts.

RECOMMENDATION:
Screening mammogram in one year.(Code:[id number])

I have discussed the findings and recommendations with the patient.
If applicable, a reminder letter will be sent to the patient
regarding the next appointment.

BI-RADS CATEGORY  2: Benign.

## 2022-03-27 ENCOUNTER — Other Ambulatory Visit (HOSPITAL_COMMUNITY): Payer: Self-pay

## 2022-03-27 MED ORDER — VALACYCLOVIR HCL 500 MG PO TABS
ORAL_TABLET | ORAL | 1 refills | Status: DC
  Filled 2022-03-27: qty 6, fill #0
  Filled 2022-03-28: qty 6, 2d supply, fill #0
  Filled 2022-03-28: qty 6, 3d supply, fill #0
  Filled 2022-10-28: qty 6, 2d supply, fill #1

## 2022-03-28 ENCOUNTER — Other Ambulatory Visit (HOSPITAL_COMMUNITY): Payer: Self-pay

## 2022-04-18 ENCOUNTER — Other Ambulatory Visit (HOSPITAL_COMMUNITY): Payer: Self-pay

## 2022-08-22 ENCOUNTER — Ambulatory Visit (INDEPENDENT_AMBULATORY_CARE_PROVIDER_SITE_OTHER): Payer: 59 | Admitting: Obstetrics & Gynecology

## 2022-08-22 ENCOUNTER — Encounter (HOSPITAL_BASED_OUTPATIENT_CLINIC_OR_DEPARTMENT_OTHER): Payer: Self-pay | Admitting: Obstetrics & Gynecology

## 2022-08-22 ENCOUNTER — Other Ambulatory Visit (HOSPITAL_COMMUNITY): Payer: Self-pay

## 2022-08-22 VITALS — BP 133/88 | HR 77 | Ht 67.0 in | Wt 194.8 lb

## 2022-08-22 DIAGNOSIS — B009 Herpesviral infection, unspecified: Secondary | ICD-10-CM | POA: Diagnosis not present

## 2022-08-22 DIAGNOSIS — Z23 Encounter for immunization: Secondary | ICD-10-CM

## 2022-08-22 DIAGNOSIS — N951 Menopausal and female climacteric states: Secondary | ICD-10-CM

## 2022-08-22 DIAGNOSIS — Z01419 Encounter for gynecological examination (general) (routine) without abnormal findings: Secondary | ICD-10-CM

## 2022-08-22 DIAGNOSIS — Z780 Asymptomatic menopausal state: Secondary | ICD-10-CM

## 2022-08-22 MED ORDER — PAROXETINE HCL 10 MG PO TABS
ORAL_TABLET | Freq: Every morning | ORAL | 4 refills | Status: DC
  Filled 2022-08-22 – 2022-10-28 (×2): qty 90, 90d supply, fill #0
  Filled 2023-01-30: qty 90, 90d supply, fill #1

## 2022-08-22 NOTE — Progress Notes (Signed)
52 y.o. G2P0 Married White or Caucasian female here for annual exam.  Denies vaginal bleeding.  Body temperature regulation is not great.  Sleep is still good.    Takes '10mg'$  Paxil.  Doing well with this.    Going on an international cruise this year.  Would like to update this today.   No LMP recorded. Patient is postmenopausal.          Sexually active: Yes.    The current method of family planning is post menopausal status.    Exercising: not regularly Smoker:  no  Health Maintenance: Pap:  05/22/2020 ASC-US History of abnormal Pap:  no MMG:  08/31/2021 Needed additional imaging Colonoscopy:  10/18/2020, follow up 10 years Screening Labs: will plan for next year   reports that she has never smoked. She has never used smokeless tobacco. She reports that she does not currently use alcohol. She reports that she does not use drugs.  Past Medical History:  Diagnosis Date   Anxiety    Bell's palsy    Cancer ([place] [facility])    melanoma (left leg behind knee) & basal cell (rt hip)   Migraines    STD (sexually transmitted disease)    HSV2    Past Surgical History:  Procedure Laterality Date   BARTHOLIN GLAND CYST EXCISION     BREAST BIOPSY Right 11/08/2011   PASH   CESAREAN SECTION     2 times    Current Outpatient Medications  Medication Sig Dispense Refill   Multiple Vitamins-Minerals (ZINC PO) Take by mouth.     PARoxetine (PAXIL) 10 MG tablet TAKE 1 TABLET BY MOUTH EVERY MORNING. 90 tablet 4   Probiotic Product (PROBIOTIC PO) Probiotic     valACYclovir (VALTREX) 500 MG tablet Take 1 tablet by mouth twice daily for 3 days at onset of symptoms. 6 tablet 1   VITAMIN D PO Take 2,000 Int'l Units by mouth daily.     No current facility-administered medications for this visit.    Family History  Problem Relation Age of Onset   Colon polyps Neg Hx    Colon cancer Neg Hx    Esophageal cancer Neg Hx    Stomach cancer Neg Hx    Rectal cancer Neg Hx     ROS: Constitutional:  negative Genitourinary:negative  Exam:   BP 133/88 (BP Location: Left Arm, Patient Position: Sitting, Cuff Size: Large)   Pulse 77   Ht '5\' 7"'$  (1.702 m) Comment: Reported  Wt 194 lb 12.8 oz (88.4 kg)   BMI 30.51 kg/m   Height: '5\' 7"'$  (170.2 cm) (Reported)  General appearance: alert, cooperative and appears stated age Head: Normocephalic, without obvious abnormality, atraumatic Neck: no adenopathy, supple, symmetrical, trachea midline and thyroid normal to inspection and palpation Lungs: clear to auscultation bilaterally Breasts: normal appearance, no masses or tenderness Heart: regular rate and rhythm Abdomen: soft, non-tender; bowel sounds normal; no masses,  no organomegaly Extremities: extremities normal, atraumatic, no cyanosis or edema Skin: Skin color, texture, turgor normal. No rashes or lesions Lymph nodes: Cervical, supraclavicular, and axillary nodes normal. No abnormal inguinal nodes palpated Neurologic: Grossly normal   Pelvic: External genitalia:  no lesions              Urethra:  normal appearing urethra with no masses, tenderness or lesions              Bartholins and Skenes: normal  Vagina: normal appearing vagina with normal color and no discharge, no lesions              Cervix: no lesions              Pap taken: No. Bimanual Exam:  Uterus:  normal size, contour, position, consistency, mobility, non-tender              Adnexa: normal adnexa and no mass, fullness, tenderness               Rectovaginal: Confirms               Anus:  normal sphincter tone, no lesions  Chaperone, Octaviano Batty, CMA, was present for exam.  Assessment/Plan: 1. Well woman exam with routine gynecological exam - Pap smear ASCUS with neg HR HPV 05/2020.  Will repeat next year. - Mammogram up to date - Colonoscopy 2022 - Bone mineral density discussed.  Plan around age 41. - lab work planned for next year. - vaccines reviewed/updated.  Tdap will be updated today.  2.  Vasomotor symptoms due to menopause - PARoxetine (PAXIL) 10 MG tablet; TAKE 1 TABLET BY MOUTH EVERY MORNING.  Dispense: 90 tablet; Refill: 4  3. HSV-2 (herpes simplex virus 2) infection - does not need RF for Valtrex today  4. Postmenopausal - not HRT

## 2022-08-23 ENCOUNTER — Other Ambulatory Visit: Payer: Self-pay | Admitting: Obstetrics & Gynecology

## 2022-08-23 DIAGNOSIS — Z1231 Encounter for screening mammogram for malignant neoplasm of breast: Secondary | ICD-10-CM

## 2022-09-10 ENCOUNTER — Other Ambulatory Visit (HOSPITAL_COMMUNITY): Payer: Self-pay

## 2022-09-17 ENCOUNTER — Ambulatory Visit
Admission: RE | Admit: 2022-09-17 | Discharge: 2022-09-17 | Disposition: A | Discharge status: Home / Self Care | Payer: 59 | Source: Ambulatory Visit

## 2022-09-17 DIAGNOSIS — Z1231 Encounter for screening mammogram for malignant neoplasm of breast: Secondary | ICD-10-CM | POA: Diagnosis not present

## 2022-10-23 NOTE — Progress Notes (Signed)
[facility] [address] [place] [address] Phone: [phone] Subjective:   Bruce Donath, am serving as a scribe for Dr. Antoine Primas.  I'm seeing this patient by the request  of:  Jerene Bears, MD  CC: Right shoulder pain  [id number]  JARIKA AGUINAGA is a 52 y.o. female coming in with complaint of R shoulder and back pain. Last seen for OMT in 2021. Patient states that she has been working through her pain over the past few years with massage. Some days she has increase in pain superior triceps that will wrap around her bicep.        Past Medical History:  Diagnosis Date   Anxiety    Bell's palsy    Cancer ([facility])    melanoma (left leg behind knee) & basal cell (rt hip)   Migraines    STD (sexually transmitted disease)    HSV2   Past Surgical History:  Procedure Laterality Date   BARTHOLIN GLAND CYST EXCISION     BREAST BIOPSY Right 11/08/2011   PASH   CESAREAN SECTION     2 times   Social History   Socioeconomic History   Marital status: Married    Spouse name: Not on file   Number of children: Not on file   Years of education: Not on file   Highest education level: Not on file  Occupational History   Not on file  Tobacco Use   Smoking status: Never   Smokeless tobacco: Never  Vaping Use   Vaping Use: Never used  Substance and Sexual Activity   Alcohol use: Not Currently   Drug use: Never   Sexual activity: Yes    Partners: Male    Birth control/protection: I.U.D.  Other Topics Concern   Not on file  Social History Narrative   Not on file   Social Determinants of Health   Financial Resource Strain: Not on file  Food Insecurity: Not on file  Transportation Needs: Not on file  Physical Activity: Not on file  Stress: Not on file  Social Connections: Not on file   Allergies  Allergen Reactions   Shellfish Allergy Nausea And Vomiting and Rash   Family History  Problem Relation Age of Onset    Colon polyps Neg Hx    Colon cancer Neg Hx    Esophageal cancer Neg Hx    Stomach cancer Neg Hx    Rectal cancer Neg Hx    Breast cancer Neg Hx          Current Outpatient Medications (Other):    Multiple Vitamins-Minerals (ZINC PO), Take by mouth.   PARoxetine (PAXIL) 10 MG tablet, TAKE 1 TABLET BY MOUTH EVERY MORNING.   Probiotic Product (PROBIOTIC PO), Probiotic   valACYclovir (VALTREX) 500 MG tablet, Take 1 tablet by mouth twice daily for 3 days at onset of symptoms.   VITAMIN D PO, Take 2,000 Int'l Units by mouth daily.   Reviewed prior external information including notes and imaging from  primary care provider As well as notes that were available from care everywhere and other healthcare systems.  Past medical history, social, surgical and family history all reviewed in electronic medical record.  No pertanent information unless stated regarding to the chief complaint.   Review of Systems:  No headache, visual changes, nausea, vomiting, diarrhea, constipation, dizziness, abdominal pain, skin rash, fevers, chills, night sweats, weight loss, swollen lymph nodes, body aches, joint swelling, chest pain, shortness  of breath, mood changes. POSITIVE muscle aches  Objective  Blood pressure 128/88, pulse 84, height 5\' 7"  (1.702 m), weight 194 lb (88 kg), SpO2 96 %.   General: No apparent distress alert and oriented x3 mood and affect normal, dressed appropriately.  HEENT: Pupils equal, extraocular movements intact  Respiratory: Patient's speak in full sentences and does not appear short of breath  Cardiovascular: No lower extremity edema, non tender, no erythema  Shoulder: Right Inspection reveals no abnormalities, atrophy or asymmetry. Palpation is normal with no tenderness over AC joint or bicipital groove. ROM is full in all planes passively. Rotator cuff strength normal throughout. signs of impingement with positive Neer and Hawkin's tests, but negative empty can  sign. Speeds and Yergason's tests normal. No labral pathology noted with negative Obrien's, negative clunk and good stability. Normal scapular function observed. No painful arc and no drop arm sign. No apprehension sign  MSK US performed of: Right This study was ordered, performed, and interpreted by Terrilee Files D.O.  Shoulder:   Supraspinatus:  Appears normal on long and transverse views, Bursal bulge seen with shoulder abduction on impingement view. Infraspinatus:  Appears normal on long and transverse views. Significant increase in Doppler flow Subscapularis:  Appears normal on long and transverse views. Positive bursa Teres Minor:  Appears normal on long and transverse views. AC joint:  Capsule distended with narrowing Glenohumeral Joint:  Appears normal without effusion. Glenoid Labrum:  Intact without visualized tears. Biceps Tendon:  Appears normal on long and transverse views, no fraying of tendon, tendon located in intertubercular groove, no subluxation with shoulder internal or external rotation.  Impression: Subacromial bursitis and AC arthritis  Procedure: Real-time Ultrasound Guided Injection of right glenohumeral joint Device: GE Logiq E  Ultrasound guided injection is preferred based studies that show increased duration, increased effect, greater accuracy, decreased procedural pain, increased response rate with ultrasound guided versus blind injection.  Verbal informed consent obtained.  Time-out conducted.  Noted no overlying erythema, induration, or other signs of local infection.  Skin prepped in a sterile fashion.  Local anesthesia: Topical Ethyl chloride.  With sterile technique and under real time ultrasound guidance:  Joint visualized.  23g 1  inch needle inserted posterior approach. Pictures taken for needle placement. Patient did have injection of , 2 cc of 0.5% Marcaine, and 1.0 cc of Kenalog 40 mg/dL. Completed without difficulty  Pain immediately resolved  suggesting accurate placement of the medication.  Advised to call if fevers/chills, erythema, induration, drainage, or persistent bleeding.  Images permanently stored and available for review in the ultrasound unit.  Impression: Technically successful ultrasound guided injection.  Procedure: Real-time Ultrasound Guided Injection of right acromioclavicular joint Device: GE Logiq Q7 Ultrasound guided injection is preferred based studies that show increased duration, increased effect, greater accuracy, decreased procedural pain, increased response rate, and decreased cost with ultrasound guided versus blind injection.  Verbal informed consent obtained.  Time-out conducted.  Noted no overlying erythema, induration, or other signs of local infection.  Skin prepped in a sterile fashion.  Local anesthesia: Topical Ethyl chloride.  With sterile technique and under real time ultrasound guidance: With a 25-gauge half inch needle injected with 0.4 cc of 0.5% Marcaine and 0.5 cc of Kenalog 40 mg/mL Completed without difficulty  Pain immediately resolved suggesting accurate placement of the medication.  Advised to call if fevers/chills, erythema, induration, drainage, or persistent bleeding.  Impression: Technically successful ultrasound guided injection.    Impression and Recommendations:     The  above documentation has been reviewed and is accurate and complete Judi Saa, DO

## 2022-10-24 ENCOUNTER — Ambulatory Visit (INDEPENDENT_AMBULATORY_CARE_PROVIDER_SITE_OTHER): Payer: 59

## 2022-10-24 ENCOUNTER — Other Ambulatory Visit: Payer: Self-pay

## 2022-10-24 ENCOUNTER — Encounter: Payer: Self-pay | Admitting: Family Medicine

## 2022-10-24 ENCOUNTER — Ambulatory Visit (INDEPENDENT_AMBULATORY_CARE_PROVIDER_SITE_OTHER): Payer: 59 | Admitting: Family Medicine

## 2022-10-24 VITALS — BP 128/88 | HR 84 | Ht 67.0 in | Wt 194.0 lb

## 2022-10-24 DIAGNOSIS — M25511 Pain in right shoulder: Secondary | ICD-10-CM | POA: Diagnosis not present

## 2022-10-24 DIAGNOSIS — M7551 Bursitis of right shoulder: Secondary | ICD-10-CM

## 2022-10-24 DIAGNOSIS — M19019 Primary osteoarthritis, unspecified shoulder: Secondary | ICD-10-CM | POA: Insufficient documentation

## 2022-10-24 DIAGNOSIS — M19011 Primary osteoarthritis, right shoulder: Secondary | ICD-10-CM | POA: Diagnosis not present

## 2022-10-24 NOTE — Assessment & Plan Note (Signed)
Injection given today and tolerated the procedure well.  Seems to be more of a chronic aspect.  Does have some very mild limited range of motion and we will continue to monitor.  Discussed icing regimen and home exercises.  Discussed that if we have worsening pain due to the duration of this and patient failing multiple conservative therapies including injection in 2020 as well as formal physical therapy would need to consider advanced imaging.

## 2022-10-24 NOTE — Assessment & Plan Note (Signed)
Injection given today, tolerated the procedure well, chronic problem with worsening symptoms.  X-rays ordered to further evaluate if any other bony abnormality other than the arthritis of the acromioclavicular joint could be potentially contributing.  Discussed icing regimen and home exercises.  Discussed which activities to potentially avoid.  Follow-up with me again 6 to 8 weeks

## 2022-10-24 NOTE — Patient Instructions (Signed)
You are awesome Injected shoulder Ice 20 minutes 2 times daily. Usually after activity and before bed. Keep hands within peripheral vison  Xray of right shoulder on way out  See me again in 6-8 weeks

## 2022-10-28 ENCOUNTER — Other Ambulatory Visit (HOSPITAL_COMMUNITY): Payer: Self-pay

## 2022-10-29 ENCOUNTER — Other Ambulatory Visit (HOSPITAL_COMMUNITY): Payer: Self-pay

## 2022-11-02 DIAGNOSIS — M79632 Pain in left forearm: Secondary | ICD-10-CM | POA: Diagnosis not present

## 2022-11-02 DIAGNOSIS — S5002XA Contusion of left elbow, initial encounter: Secondary | ICD-10-CM | POA: Diagnosis not present

## 2022-11-02 DIAGNOSIS — M25522 Pain in left elbow: Secondary | ICD-10-CM | POA: Diagnosis not present

## 2022-11-02 DIAGNOSIS — S5012XA Contusion of left forearm, initial encounter: Secondary | ICD-10-CM | POA: Diagnosis not present

## 2022-11-04 ENCOUNTER — Encounter: Payer: Self-pay | Admitting: Family Medicine

## 2022-11-05 ENCOUNTER — Ambulatory Visit (INDEPENDENT_AMBULATORY_CARE_PROVIDER_SITE_OTHER): Payer: 59 | Admitting: Sports Medicine

## 2022-11-05 VITALS — HR 86 | Ht 67.0 in | Wt 190.0 lb

## 2022-11-05 DIAGNOSIS — M25522 Pain in left elbow: Secondary | ICD-10-CM

## 2022-11-05 NOTE — Patient Instructions (Addendum)
Good to see you Sling at all times  Can complete prednisone dos pak Tylenol [phone] mg 2-3 times a day for pain relief  No lifting with left arm  1 week follow up repeat xray

## 2022-11-05 NOTE — Progress Notes (Signed)
Benito Mccreedy D.[facility] [place] Phone: [phone]   Assessment and Plan:     1. Left elbow pain  -Acute, complicated, initial sports medicine visit - High suspicion for radial head fracture based on HPI, physical exam - Patient brought in x-ray images from urgent care.  Patient was told at urgent care that she had a "chip" at the elbow.  I do not see a chip or an overt fracture, however I do not have the x-ray report on this dose, and suboptimal quality from this disc - Based on suspicion for potential radial head fracture, we will treat with conservative measures including nonweightbearing with left upper extremity, brace use at all times - May continue and complete prednisone course - Start Tylenol 500 to 1000 mg tablets 2-3 times a day for day-to-day pain relief  Pertinent previous records reviewed include elbow x-ray on disc brought by patient   Follow Up: 1 week for reevaluation.  Would reimage left elbow x-ray.  If continued left hand pain, would obtain left wrist x-ray with scaphoid   Subjective:   I, Melissa [name], am serving as a Education administrator for Doctor Glennon Mac  Chief Complaint: left arm pain   HPI:   11/05/22 Patient is a 52 year old female complaining of left arm pain. Patient states had a fall over the weekend  last Friday while we were out of town at my in-laws. I had an X-ray (chipped elbow but doesn't show other breaks - they gave me the disk) done and they put me on a Prednisone pack and that has been helping a little . The arm hurts and I can't turn my arm or pick up/hold things without pain. Decreased ROM, decreased grip strength, she has a sling and compression wrap on   Relevant Historical Information: None pertinent  Additional pertinent review of systems negative.   Current Outpatient Medications:    Multiple Vitamins-Minerals (ZINC PO), Take by mouth., Disp: , Rfl:    PARoxetine  (PAXIL) 10 MG tablet, TAKE 1 TABLET BY MOUTH EVERY MORNING., Disp: 90 tablet, Rfl: 4   Probiotic Product (PROBIOTIC PO), Probiotic, Disp: , Rfl:    valACYclovir (VALTREX) 500 MG tablet, Take 1 tablet by mouth twice daily for 3 days at onset of symptoms., Disp: 6 tablet, Rfl: 1   VITAMIN D PO, Take 2,000 Int'l Units by mouth daily., Disp: , Rfl:    Objective:     Vitals:   11/05/22 1114  Pulse: 86  SpO2: 99%  Weight: 190 lb (86.2 kg)  Height: 5\' 7"  (1.702 m)      Body mass index is 29.76 kg/m.    Physical Exam:    General: Appears well, no acute distress, nontoxic and pleasant Neck: FROM, no pain Neuro: sensation is intact distally with no deficits, strenghth is 5/5 in elbow flexors/extenders/supinator/pronators and wrist flexors/extensors Psych: no evidence of anxiety or depression  Left elbow: No deformity, swelling or muscle wasting.  Abrasion over olecranon and lateral elbow Decreased carrying angle with patient supporting left arm due to pain ROM: 10-120, supination and pronation 70 TTP significantly radial head, moderately lateral epicondyle NTTP over triceps, ticeps tendon, olecronon, medial epicondyle, antecubital fossa, biceps tendon,   Negative tinnels over cubital tunnel    pain with resisted supination   pain with resisted pronation Negative valgus stress Negative varus stress     Electronically signed by:  Benito Mccreedy [facility] 11:45  AM 11/05/22

## 2022-11-11 NOTE — Progress Notes (Unsigned)
    Aleen [facility]ells D.[facility] [address] [place] [address] Phone: [phone]   Assessment and Plan:     There are no diagnoses linked to this encounter.  ***   Pertinent previous records reviewed include ***   Follow Up: ***     Subjective:   I, Jenny Lai, am serving as a Neurosurgeon for Doctor Richardean Sale   Chief Complaint: left arm pain    HPI:    11/05/22 Patient is a 52 year old female complaining of left arm pain. Patient states had a fall over the weekend  last Friday while we were out of town at my in-laws. I had an X-ray (chipped elbow but doesn't show other breaks - they gave me the disk) done and they put me on a Prednisone pack and that has been helping a little . The arm hurts and I can't turn my arm or pick up/hold things without pain. Decreased ROM, decreased grip strength, she has a sling and compression wrap on   11/12/2022 Patient states    Relevant Historical Information: None pertinent  Additional pertinent review of systems negative.   Current Outpatient Medications:    Multiple Vitamins-Minerals (ZINC PO), Take by mouth., Disp: , Rfl:    PARoxetine (PAXIL) 10 MG tablet, TAKE 1 TABLET BY MOUTH EVERY MORNING., Disp: 90 tablet, Rfl: 4   Probiotic Product (PROBIOTIC PO), Probiotic, Disp: , Rfl:    valACYclovir (VALTREX) 500 MG tablet, Take 1 tablet by mouth twice daily for 3 days at onset of symptoms., Disp: 6 tablet, Rfl: 1   VITAMIN D PO, Take 2,000 Int'l Units by mouth daily., Disp: , Rfl:    Objective:     There were no vitals filed for this visit.    There is no height or weight on file to calculate BMI.    Physical Exam:    ***   Electronically signed by:  Aleen Sells D.[facility] 9:27 AM 11/11/22

## 2022-11-12 ENCOUNTER — Other Ambulatory Visit (HOSPITAL_COMMUNITY): Payer: Self-pay

## 2022-11-12 ENCOUNTER — Ambulatory Visit (INDEPENDENT_AMBULATORY_CARE_PROVIDER_SITE_OTHER): Payer: 59 | Admitting: Sports Medicine

## 2022-11-12 ENCOUNTER — Ambulatory Visit (INDEPENDENT_AMBULATORY_CARE_PROVIDER_SITE_OTHER): Payer: 59

## 2022-11-12 VITALS — BP 110/78 | HR 90 | Ht 67.0 in | Wt 190.0 lb

## 2022-11-12 DIAGNOSIS — M25522 Pain in left elbow: Secondary | ICD-10-CM | POA: Diagnosis not present

## 2022-11-12 DIAGNOSIS — M25532 Pain in left wrist: Secondary | ICD-10-CM | POA: Diagnosis not present

## 2022-11-12 MED ORDER — MELOXICAM 15 MG PO TABS
15.0000 mg | ORAL_TABLET | Freq: Every day | ORAL | 0 refills | Status: DC
  Filled 2022-11-12: qty 30, 30d supply, fill #0

## 2022-11-12 NOTE — Patient Instructions (Addendum)
Good to see you  - Start meloxicam 15 mg daily x2 weeks.  If still having pain after 2 weeks, complete 3rd-week of meloxicam. May use remaining meloxicam as needed once daily for pain control.  Do not to use additional NSAIDs while taking meloxicam.  May use Tylenol [phone] mg 2 to 3 times a day for breakthrough pain. Can use sling as needed Elbow and wrist HEP  4 week follow up with Dr. Katrinka Blazing

## 2022-11-25 ENCOUNTER — Ambulatory Visit: Payer: 59 | Admitting: Family Medicine

## 2022-12-11 NOTE — Progress Notes (Unsigned)
Melissa [name] Phone: [phone] Subjective:   Bruce Donath, am serving as a scribe for Dr. Antoine Primas.  I'm seeing this patient by the request  of:  Jerene Bears, MD  CC: Right shoulder pain follow-up, back pain and neck pain follow-up  [id number]  10/24/2022 Injection given today, tolerated the procedure well, chronic problem with worsening symptoms.  X-rays ordered to further evaluate if any other bony abnormality other than the arthritis of the acromioclavicular joint could be potentially contributing.  Discussed icing regimen and home exercises.  Discussed which activities to potentially avoid.  Follow-up with me again 6 to 8 weeks     Injection given today and tolerated the procedure well.  Seems to be more of a chronic aspect.  Does have some very mild limited range of motion and we will continue to monitor.  Discussed icing regimen and home exercises.  Discussed that if we have worsening pain due to the duration of this and patient failing multiple conservative therapies including injection in 2020 as well as formal physical therapy would need to consider advanced imaging.      Update 12/12/2022 Melissa [name] is a 52 y.o. female coming in with complaint of R shoulder pain. Has been seeing Dr. Jean Rosenthal for L elbow and wrist pain. Patient states that she fell over Easter. Was unable to supinate with pain. Pain has improved.   Shoulder pain had improved but last week her pain started to come back. Popping sensation in shoulder started again last week.        Past Medical History:  Diagnosis Date   Anxiety    Bell's palsy    Cancer (HCC)    melanoma (left leg behind knee) & basal cell (rt hip)   Migraines    STD (sexually transmitted disease)    HSV2   Past Surgical History:  Procedure Laterality Date   BARTHOLIN GLAND CYST EXCISION     BREAST BIOPSY Right 11/08/2011   PASH   CESAREAN  SECTION     2 times   Social History   Socioeconomic History   Marital status: Married    Spouse name: Not on file   Number of children: Not on file   Years of education: Not on file   Highest education level: Not on file  Occupational History   Not on file  Tobacco Use   Smoking status: Never   Smokeless tobacco: Never  Vaping Use   Vaping Use: Never used  Substance and Sexual Activity   Alcohol use: Not Currently   Drug use: Never   Sexual activity: Yes    Partners: Male    Birth control/protection: I.U.D.  Other Topics Concern   Not on file  Social History Narrative   Not on file   Social Determinants of Health   Financial Resource Strain: Not on file  Food Insecurity: Not on file  Transportation Needs: Not on file  Physical Activity: Not on file  Stress: Not on file  Social Connections: Not on file   Allergies  Allergen Reactions   Shellfish Allergy Nausea And Vomiting and Rash   Family History  Problem Relation Age of Onset   Colon polyps Neg Hx    Colon cancer Neg Hx    Esophageal cancer Neg Hx    Stomach cancer Neg Hx    Rectal cancer Neg Hx    Breast cancer Neg Hx  Current Outpatient Medications (Analgesics):    meloxicam (MOBIC) 15 MG tablet, Take 1 tablet (15 mg total) by mouth daily.   Current Outpatient Medications (Other):    Multiple Vitamins-Minerals (ZINC PO), Take by mouth.   PARoxetine (PAXIL) 10 MG tablet, TAKE 1 TABLET BY MOUTH EVERY MORNING.   Probiotic Product (PROBIOTIC PO), Probiotic   valACYclovir (VALTREX) 500 MG tablet, Take 1 tablet by mouth twice daily for 3 days at onset of symptoms.   VITAMIN D PO, Take 2,000 Int'l Units by mouth daily.   Reviewed prior external information including notes and imaging from  primary care provider As well as notes that were available from care everywhere and other healthcare systems.  Past medical history, social, surgical and family history all reviewed in electronic medical  record.  No pertanent information unless stated regarding to the chief complaint.   Review of Systems:  No headache, visual changes, nausea, vomiting, diarrhea, constipation, dizziness, abdominal pain, skin rash, fevers, chills, night sweats, weight loss, swollen lymph nodes, body aches, joint swelling, chest pain, shortness of breath, mood changes. POSITIVE muscle aches  Objective  Blood pressure 114/80, pulse 87, height 5\' 7"  (1.702 m), weight 190 lb (86.2 kg), SpO2 97 %.   General: No apparent distress alert and oriented x3 mood and affect normal, dressed appropriately.  HEENT: Pupils equal, extraocular movements intact  Respiratory: Patient's speak in full sentences and does not appear short of breath  Cardiovascular: No lower extremity edema, non tender, no erythema  Left elbow exam has full range of motion noted.  Some tenderness to palpation in the paraspinal musculature. Right shoulder exam has a positive impingement noted.  Patient does have mild positive crossover noted.  Scapular dyskinesis noted.  Limited muscular skeletal ultrasound was performed and interpreted by Antoine Primas, M  Limited ultrasound shows some mild hypoechoic changes noted.  This seems to be mostly of the acromioclavicular joint.  Subacromial area though very minimal amount.  Significant improvement.  Rotator cuff appears to be intact Impression: Acromioclavicular arthritis  Osteopathic findings  C2 flexed rotated and side bent right C6 flexed rotated and side bent left T3 extended rotated and side bent right inhaled third rib T9 extended rotated and side bent left    Impression and Recommendations:       AC (acromioclavicular) arthritis Still has some arthritic changes noted.  Discussed with patient about icing regimen and home exercises.  We discussed that with the arthritis we may need to consider the possibility of advanced imaging.  At this moment patient does feel better than what she did  previously.  Will follow-up with me again in 6 to 8 weeks.  Attempted osteopathic manipulation with good resolution.  Chronic shoulder bursitis, right Nearly complete resolution at this time.  Will continue to monitor.  Nonallopathic lesion of thoracic region   Decision today to treat with OMT was based on Physical Exam  After verbal consent patient was treated with HVLA, ME, FPR techniques in cervical, thoracic, rib areas, all areas are chronic   Patient tolerated the procedure well with improvement in symptoms  Patient given exercises, stretches and lifestyle modifications  See medications in patient instructions if given  Patient will follow up in 4-8 weeks  The above documentation has been reviewed and is accurate and complete Judi Saa, DO

## 2022-12-12 ENCOUNTER — Encounter: Payer: Self-pay | Admitting: Family Medicine

## 2022-12-12 ENCOUNTER — Ambulatory Visit (INDEPENDENT_AMBULATORY_CARE_PROVIDER_SITE_OTHER): Payer: 59 | Admitting: Family Medicine

## 2022-12-12 ENCOUNTER — Other Ambulatory Visit: Payer: Self-pay

## 2022-12-12 VITALS — BP 114/80 | HR 87 | Ht 67.0 in | Wt 190.0 lb

## 2022-12-12 DIAGNOSIS — M9908 Segmental and somatic dysfunction of rib cage: Secondary | ICD-10-CM

## 2022-12-12 DIAGNOSIS — M999 Biomechanical lesion, unspecified: Secondary | ICD-10-CM | POA: Diagnosis not present

## 2022-12-12 DIAGNOSIS — M7551 Bursitis of right shoulder: Secondary | ICD-10-CM | POA: Diagnosis not present

## 2022-12-12 DIAGNOSIS — M9902 Segmental and somatic dysfunction of thoracic region: Secondary | ICD-10-CM | POA: Diagnosis not present

## 2022-12-12 DIAGNOSIS — M9901 Segmental and somatic dysfunction of cervical region: Secondary | ICD-10-CM | POA: Diagnosis not present

## 2022-12-12 DIAGNOSIS — M19011 Primary osteoarthritis, right shoulder: Secondary | ICD-10-CM

## 2022-12-12 DIAGNOSIS — M25511 Pain in right shoulder: Secondary | ICD-10-CM | POA: Diagnosis not present

## 2022-12-12 NOTE — Assessment & Plan Note (Signed)
Still has some arthritic changes noted.  Discussed with patient about icing regimen and home exercises.  We discussed that with the arthritis we may need to consider the possibility of advanced imaging.  At this moment patient does feel better than what she did previously.  Will follow-up with me again in 6 to 8 weeks.  Attempted osteopathic manipulation with good resolution.

## 2022-12-12 NOTE — Assessment & Plan Note (Signed)
   Decision today to treat with OMT was based on Physical Exam  After verbal consent patient was treated with HVLA, ME, FPR techniques in cervical, thoracic, rib areas, all areas are chronic   Patient tolerated the procedure well with improvement in symptoms  Patient given exercises, stretches and lifestyle modifications  See medications in patient instructions if given  Patient will follow up in 4-8 weeks 

## 2022-12-12 NOTE — Assessment & Plan Note (Signed)
Nearly complete resolution at this time.  Will continue to monitor.

## 2022-12-12 NOTE — Patient Instructions (Signed)
Keep watching shoulder See me in 6-8 weeks

## 2023-01-22 NOTE — Progress Notes (Signed)
[facility] [address] [place] [address] Phone: [phone] Subjective:   INadine Counts, am serving as a scribe for Dr. Antoine Primas.  I'm seeing this patient by the request  of:  Jerene Bears, MD  CC: Neck pain and right shoulder pain follow-up  [id number]  Melissa [name] is a 52 y.o. female coming in with complaint of back and neck pain. OMT 12/12/2022. Patient states same per usual. No new concerns.  Feels like the of the right shoulder is not making improvement.  Waking her up at night, starting to have some limitation in range of motion.  Difficulty with even daily activities of the living such as dressing without having some type of pain.  Medications patient has been prescribed: None  Taking:         Reviewed prior external information including notes and imaging from previsou exam, outside providers and external EMR if available.   As well as notes that were available from care everywhere and other healthcare systems.  Past medical history, social, surgical and family history all reviewed in electronic medical record.  No pertanent information unless stated regarding to the chief complaint.   Past Medical History:  Diagnosis Date   Anxiety    Bell's palsy    Cancer (HCC)    melanoma (left leg behind knee) & basal cell (rt hip)   Migraines    STD (sexually transmitted disease)    HSV2    Allergies  Allergen Reactions   Shellfish Allergy Nausea And Vomiting and Rash     Review of Systems:  No headache, visual changes, nausea, vomiting, diarrhea, constipation, dizziness, abdominal pain, skin rash, fevers, chills, night sweats, weight loss, swollen lymph nodes, body aches, joint swelling, chest pain, shortness of breath, mood changes. POSITIVE muscle aches  Objective  Blood pressure 112/82, pulse 76, height 5\' 7"  (1.702 m), weight 197 lb (89.4 kg), SpO2 98 %.   General: No apparent distress alert and oriented  x3 mood and affect normal, dressed appropriately.  HEENT: Pupils equal, extraocular movements intact  Respiratory: Patient's speak in full sentences and does not appear short of breath  Cardiovascular: No lower extremity edema, non tender, no erythema  Neck exam does have some loss lordosis noted.  Some tightness noted on the right side of the neck.  With patient's right shoulder positive impingement.  Positive Hawkins.  Does have some limitation in internal and external range of motion.  Osteopathic findings  C2 flexed rotated and side bent right C7 flexed rotated and side bent right T3 extended rotated and side bent right inhaled rib L3 flexed rotated and side bent right Sacrum right on right       Assessment and Plan:  Chronic shoulder bursitis, right Chronic pain with worsening symptoms and now some weakness as well as limited range of motion.  Concern for potential labral pathology.  Failed formal physical therapy, injections, icing regimen, home exercises, and as well as trigger point injections in the surrounding area and oral anti-inflammatories.  At this point do feel advanced imaging is warranted and MR arthrogram is ordered.  Patient will follow-up after imaging to discuss further    Nonallopathic problems  Decision today to treat with OMT was based on Physical Exam  After verbal consent patient was treated with HVLA, ME, FPR techniques in cervical, rib, thoracic, lumbar, and sacral  areas  Patient tolerated the procedure well with improvement in symptoms  Patient given exercises, stretches  and lifestyle modifications  See medications in patient instructions if given  Patient will follow up in 4-8 weeks     The above documentation has been reviewed and is accurate and complete Judi Saa, DO         Note: This dictation was prepared with Dragon dictation along with smaller phrase technology. Any transcriptional errors that result from this process are  unintentional.

## 2023-01-28 ENCOUNTER — Ambulatory Visit (INDEPENDENT_AMBULATORY_CARE_PROVIDER_SITE_OTHER): Payer: 59 | Admitting: Family Medicine

## 2023-01-28 ENCOUNTER — Encounter: Payer: Self-pay | Admitting: Family Medicine

## 2023-01-28 VITALS — BP 112/82 | HR 76 | Ht 67.0 in | Wt 197.0 lb

## 2023-01-28 DIAGNOSIS — M9903 Segmental and somatic dysfunction of lumbar region: Secondary | ICD-10-CM | POA: Diagnosis not present

## 2023-01-28 DIAGNOSIS — M9908 Segmental and somatic dysfunction of rib cage: Secondary | ICD-10-CM

## 2023-01-28 DIAGNOSIS — M9904 Segmental and somatic dysfunction of sacral region: Secondary | ICD-10-CM | POA: Diagnosis not present

## 2023-01-28 DIAGNOSIS — M9901 Segmental and somatic dysfunction of cervical region: Secondary | ICD-10-CM | POA: Diagnosis not present

## 2023-01-28 DIAGNOSIS — M9902 Segmental and somatic dysfunction of thoracic region: Secondary | ICD-10-CM

## 2023-01-28 DIAGNOSIS — M25511 Pain in right shoulder: Secondary | ICD-10-CM | POA: Diagnosis not present

## 2023-01-28 DIAGNOSIS — M7551 Bursitis of right shoulder: Secondary | ICD-10-CM | POA: Diagnosis not present

## 2023-01-28 NOTE — Patient Instructions (Signed)
[facility] [phone] Call Today  When we receive your results we will contact you.  See you again in 2 months

## 2023-01-28 NOTE — Assessment & Plan Note (Signed)
Chronic pain with worsening symptoms and now some weakness as well as limited range of motion.  Concern for potential labral pathology.  Failed formal physical therapy, injections, icing regimen, home exercises, and as well as trigger point injections in the surrounding area and oral anti-inflammatories.  At this point do feel advanced imaging is warranted and MR arthrogram is ordered.  Patient will follow-up after imaging to discuss further

## 2023-01-30 ENCOUNTER — Other Ambulatory Visit (HOSPITAL_BASED_OUTPATIENT_CLINIC_OR_DEPARTMENT_OTHER): Payer: Self-pay | Admitting: Obstetrics & Gynecology

## 2023-01-30 ENCOUNTER — Other Ambulatory Visit (HOSPITAL_COMMUNITY): Payer: Self-pay

## 2023-01-30 DIAGNOSIS — B009 Herpesviral infection, unspecified: Secondary | ICD-10-CM

## 2023-01-30 MED ORDER — VALACYCLOVIR HCL 500 MG PO TABS
ORAL_TABLET | ORAL | 3 refills | Status: DC
  Filled 2023-01-30: qty 6, fill #0
  Filled 2023-02-03 – 2023-02-17 (×2): qty 6, 3d supply, fill #0

## 2023-01-31 ENCOUNTER — Other Ambulatory Visit (HOSPITAL_COMMUNITY): Payer: Self-pay

## 2023-01-31 ENCOUNTER — Other Ambulatory Visit: Payer: Self-pay

## 2023-02-03 ENCOUNTER — Other Ambulatory Visit (HOSPITAL_COMMUNITY): Payer: Self-pay

## 2023-02-12 ENCOUNTER — Other Ambulatory Visit (HOSPITAL_COMMUNITY): Payer: Self-pay

## 2023-02-17 ENCOUNTER — Other Ambulatory Visit (HOSPITAL_COMMUNITY): Payer: Self-pay

## 2023-02-19 ENCOUNTER — Ambulatory Visit
Admission: RE | Admit: 2023-02-19 | Discharge: 2023-02-19 | Disposition: A | Discharge status: Home / Self Care | Payer: 59 | Source: Ambulatory Visit | Attending: Family Medicine | Admitting: Family Medicine

## 2023-02-19 ENCOUNTER — Other Ambulatory Visit: Payer: 59

## 2023-02-19 DIAGNOSIS — M67813 Other specified disorders of tendon, right shoulder: Secondary | ICD-10-CM | POA: Diagnosis not present

## 2023-02-19 DIAGNOSIS — M25511 Pain in right shoulder: Secondary | ICD-10-CM

## 2023-02-19 DIAGNOSIS — M19011 Primary osteoarthritis, right shoulder: Secondary | ICD-10-CM | POA: Diagnosis not present

## 2023-02-19 MED ORDER — IOPAMIDOL (ISOVUE-M 200) INJECTION 41%
13.0000 mL | Freq: Once | INTRAMUSCULAR | Status: AC
  Administered 2023-02-19: 13 mL via INTRA_ARTICULAR

## 2023-03-02 ENCOUNTER — Encounter: Payer: Self-pay | Admitting: Family Medicine

## 2023-03-04 ENCOUNTER — Ambulatory Visit: Payer: 59 | Admitting: Family Medicine

## 2023-03-04 DIAGNOSIS — Z86018 Personal history of other benign neoplasm: Secondary | ICD-10-CM | POA: Diagnosis not present

## 2023-03-04 DIAGNOSIS — D2239 Melanocytic nevi of other parts of face: Secondary | ICD-10-CM | POA: Diagnosis not present

## 2023-03-04 DIAGNOSIS — L821 Other seborrheic keratosis: Secondary | ICD-10-CM | POA: Diagnosis not present

## 2023-03-04 DIAGNOSIS — L814 Other melanin hyperpigmentation: Secondary | ICD-10-CM | POA: Diagnosis not present

## 2023-03-04 DIAGNOSIS — D225 Melanocytic nevi of trunk: Secondary | ICD-10-CM | POA: Diagnosis not present

## 2023-03-04 DIAGNOSIS — L578 Other skin changes due to chronic exposure to nonionizing radiation: Secondary | ICD-10-CM | POA: Diagnosis not present

## 2023-03-04 DIAGNOSIS — Z85820 Personal history of malignant melanoma of skin: Secondary | ICD-10-CM | POA: Diagnosis not present

## 2023-03-04 NOTE — Progress Notes (Deleted)
[facility] [address] [place] [address] Phone: [phone] Subjective:    I'm seeing this patient by the request  of:  Jerene Bears, MD  CC: Right shoulder pain follow-up  [id number]  Melissa [name] is a 52 y.o. female coming in with complaint of right shoulder pain.  MRI did show the patient does have a small partial-thickness tear of the articular send surface of the supraspinatus tendon.  Moderate glenohumeral arthritis also noted.     Past Medical History:  Diagnosis Date   Anxiety    Bell's palsy    Cancer (HCC)    melanoma (left leg behind knee) & basal cell (rt hip)   Migraines    STD (sexually transmitted disease)    HSV2   Past Surgical History:  Procedure Laterality Date   BARTHOLIN GLAND CYST EXCISION     BREAST BIOPSY Right 11/08/2011   PASH   CESAREAN SECTION     2 times   Social History   Socioeconomic History   Marital status: Married    Spouse name: Not on file   Number of children: Not on file   Years of education: Not on file   Highest education level: Not on file  Occupational History   Not on file  Tobacco Use   Smoking status: Never   Smokeless tobacco: Never  Vaping Use   Vaping status: Never Used  Substance and Sexual Activity   Alcohol use: Not Currently   Drug use: Never   Sexual activity: Yes    Partners: Male    Birth control/protection: I.U.D.  Other Topics Concern   Not on file  Social History Narrative   Not on file   Social Determinants of Health   Financial Resource Strain: Not on file  Food Insecurity: Not on file  Transportation Needs: Not on file  Physical Activity: Not on file  Stress: Not on file  Social Connections: Not on file   Allergies  Allergen Reactions   Shellfish Allergy Nausea And Vomiting and Rash   Family History  Problem Relation Age of Onset   Colon polyps Neg Hx    Colon cancer Neg Hx    Esophageal cancer Neg Hx    Stomach cancer Neg Hx     Rectal cancer Neg Hx    Breast cancer Neg Hx        Current Outpatient Medications (Analgesics):    meloxicam (MOBIC) 15 MG tablet, Take 1 tablet (15 mg total) by mouth daily.   Current Outpatient Medications (Other):    Multiple Vitamins-Minerals (ZINC PO), Take by mouth.   PARoxetine (PAXIL) 10 MG tablet, TAKE 1 TABLET BY MOUTH EVERY MORNING.   Probiotic Product (PROBIOTIC PO), Probiotic   valACYclovir (VALTREX) 500 MG tablet, Take 1 tablet by mouth twice daily for 3 days at onset of symptoms.   VITAMIN D PO, Take 2,000 Int'l Units by mouth daily.     Objective  There were no vitals taken for this visit.   General: No apparent distress alert and oriented x3 mood and affect normal, dressed appropriately.   Procedure: Real-time Ultrasound Guided Injection of right supraspinatus tendon sheath Device: GE Logiq Q7 Ultrasound guided injection is preferred based studies that show increased duration, increased effect, greater accuracy, decreased procedural pain, increased response rate, and decreased cost with ultrasound guided versus blind injection.  Verbal informed consent obtained.  Time-out conducted.  Noted no overlying erythema, induration, or other signs of local infection.  Skin prepped in a sterile fashion.  Local anesthesia: Topical Ethyl chloride.  With sterile technique and under real time ultrasound guidance: With a 21-gauge 2 inch needle injected with 0.5 cc of 0.5% Marcaine and then injected with 4 cc of PRP Completed without difficulty  Pain immediately resolved suggesting accurate placement of the medication.  Advised to call if fevers/chills, erythema, induration, drainage, or persistent bleeding.  Impression: Technically successful ultrasound guided injection.    Impression and Recommendations:    The above documentation has been reviewed and is accurate and complete Judi Saa, DO

## 2023-03-13 NOTE — Progress Notes (Signed)
  [facility] [address] [place] [address] Phone: [phone] Subjective:   Bruce Donath, am serving as a scribe for Dr. Antoine Primas.  I'm seeing this patient by the request  of:  Jerene Bears, MD  CC: Right shoulder pain  [id number]  Melissa [name] is a 52 y.o. female coming in with complaint of back and neck pain. OMT on 01/28/2023.  Was seen more for her right shoulder recently and was found to have a partial tear of the supraspinatus.  Moderate OA of the glenohumeral joint.  Patient is here for PRP    Past Medical History:  Diagnosis Date   Anxiety    Bell's palsy    Cancer (HCC)    melanoma (left leg behind knee) & basal cell (rt hip)   Migraines    STD (sexually transmitted disease)    HSV2    Allergies  Allergen Reactions   Shellfish Allergy Nausea And Vomiting and Rash      Objective  Blood pressure 118/87, pulse 92, height 5\' 7"  (1.702 m), weight 198 lb (89.8 kg), SpO2 97%.   General: No apparent distress alert and oriented x3 mood and affect normal, dressed appropriately.   Procedure: Real-time Ultrasound Guided Injection of right supraspinatus tendon sheath Device: GE Logiq Q7  Ultrasound guided injection is preferred based studies that show increased duration, increased effect, greater accuracy, decreased procedural pain, increased response rate with ultrasound guided versus blind injection.  Verbal informed consent obtained.  Time-out conducted.  Noted no overlying erythema, induration, or other signs of local infection.  Skin prepped in a sterile fashion.  Local anesthesia: Topical Ethyl chloride.  With sterile technique and under real time ultrasound guidance:  Joint visualized.  23g 1  inch needle inserted posterior approach. Pictures taken for needle placement. Patient did have injection of  2 cc of 0.5% Marcaine, and then 5 cc of PRP leukocyte rich in the supraspinatus tendon sheath Completed  without difficulty  Advised to call if fevers/chills, erythema, induration, drainage, or persistent bleeding.  Impression: Technically successful ultrasound guided injection.     Assessment and Plan:  Rotator cuff tear, right Partial small tear with underlying glenohumeral arthritis.  Will monitor.  Discussed post PRP care.  Follow-up again in 6 weeks        The above documentation has been reviewed and is accurate and complete Judi Saa, DO         Note: This dictation was prepared with Dragon dictation along with smaller phrase technology. Any transcriptional errors that result from this process are unintentional.

## 2023-03-17 ENCOUNTER — Other Ambulatory Visit: Payer: Self-pay

## 2023-03-17 ENCOUNTER — Encounter: Payer: Self-pay | Admitting: Family Medicine

## 2023-03-17 ENCOUNTER — Ambulatory Visit (INDEPENDENT_AMBULATORY_CARE_PROVIDER_SITE_OTHER): Payer: Self-pay | Admitting: Family Medicine

## 2023-03-17 VITALS — BP 118/87 | HR 92 | Ht 67.0 in | Wt 198.0 lb

## 2023-03-17 DIAGNOSIS — M75111 Incomplete rotator cuff tear or rupture of right shoulder, not specified as traumatic: Secondary | ICD-10-CM

## 2023-03-17 DIAGNOSIS — M25511 Pain in right shoulder: Secondary | ICD-10-CM

## 2023-03-17 DIAGNOSIS — M75101 Unspecified rotator cuff tear or rupture of right shoulder, not specified as traumatic: Secondary | ICD-10-CM | POA: Insufficient documentation

## 2023-03-17 NOTE — Patient Instructions (Signed)
No ice or IBU for 3 days Heat and Tylenol are ok See me again in 6 weeks 

## 2023-03-17 NOTE — Assessment & Plan Note (Signed)
Partial small tear with underlying glenohumeral arthritis.  Will monitor.  Discussed post PRP care.  Follow-up again in 6 weeks

## 2023-03-31 NOTE — Progress Notes (Unsigned)
[facility] [address] [place] [address] Phone: [phone] Subjective:   INadine Counts, am serving as a scribe for Dr. Antoine Primas.  I'm seeing this patient by the request  of:  Jerene Bears, MD  CC: Neck and back pain follow-up  [id number]  03/17/2023 Partial small tear with underlying glenohumeral arthritis.  Will monitor.  Discussed post PRP care.  Follow-up again in 6 weeks   Update 03/31/2023 Melissa [name] is a 52 y.o. female coming in with complaint of R shoulder pain. Patient states shoulder feels better. No new concerns. Patient feels like there is some tightness noted at the neck and in the scapular area but nothing is      Past Medical History:  Diagnosis Date   Anxiety    Bell's palsy    Cancer (HCC)    melanoma (left leg behind knee) & basal cell (rt hip)   Migraines    STD (sexually transmitted disease)    HSV2   Past Surgical History:  Procedure Laterality Date   BARTHOLIN GLAND CYST EXCISION     BREAST BIOPSY Right 11/08/2011   PASH   CESAREAN SECTION     2 times   Social History   Socioeconomic History   Marital status: Married    Spouse name: Not on file   Number of children: Not on file   Years of education: Not on file   Highest education level: Not on file  Occupational History   Not on file  Tobacco Use   Smoking status: Never   Smokeless tobacco: Never  Vaping Use   Vaping status: Never Used  Substance and Sexual Activity   Alcohol use: Not Currently   Drug use: Never   Sexual activity: Yes    Partners: Male    Birth control/protection: I.U.D.  Other Topics Concern   Not on file  Social History Narrative   Not on file   Social Determinants of Health   Financial Resource Strain: Not on file  Food Insecurity: Not on file  Transportation Needs: Not on file  Physical Activity: Not on file  Stress: Not on file  Social Connections: Not on file   Allergies  Allergen  Reactions   Shellfish Allergy Nausea And Vomiting and Rash   Family History  Problem Relation Age of Onset   Colon polyps Neg Hx    Colon cancer Neg Hx    Esophageal cancer Neg Hx    Stomach cancer Neg Hx    Rectal cancer Neg Hx    Breast cancer Neg Hx        Current Outpatient Medications (Analgesics):    meloxicam (MOBIC) 15 MG tablet, Take 1 tablet (15 mg total) by mouth daily.   Current Outpatient Medications (Other):    Multiple Vitamins-Minerals (ZINC PO), Take by mouth.   PARoxetine (PAXIL) 10 MG tablet, TAKE 1 TABLET BY MOUTH EVERY MORNING.   Probiotic Product (PROBIOTIC PO), Probiotic   valACYclovir (VALTREX) 500 MG tablet, Take 1 tablet by mouth twice daily for 3 days at onset of symptoms.   VITAMIN D PO, Take 2,000 Int'l Units by mouth daily.   Reviewed prior external information including notes and imaging from  primary care provider As well as notes that were available from care everywhere and other healthcare systems.  Past medical history, social, surgical and family history all reviewed in electronic medical record.  No pertanent information unless stated regarding to the chief complaint.  Review of Systems:  No headache, visual changes, nausea, vomiting, diarrhea, constipation, dizziness, abdominal pain, skin rash, fevers, chills, night sweats, weight loss, swollen lymph nodes, body aches, joint swelling, chest pain, shortness of breath, mood changes. POSITIVE muscle aches  Objective  Blood pressure 114/78, pulse 96, height 5\' 7"  (1.702 m), weight 200 lb (90.7 kg), SpO2 99%.   General: No apparent distress alert and oriented x3 mood and affect normal, dressed appropriately.  HEENT: Pupils equal, extraocular movements intact  Respiratory: Patient's speak in full sentences and does not appear short of breath  Cardiovascular: No lower extremity edema, non tender, no erythema  Patient is still in tightness noted in the parascapular area right greater than  left.  Multiple trigger points noted still.  Patient does have good range of motion otherwise.  Osteopathic findings C2 flexed rotated and side bent right T5 extended rotated and side bent right inhaled third rib     Impression and Recommendations:     The above documentation has been reviewed and is accurate and complete Judi Saa, DO

## 2023-04-01 ENCOUNTER — Other Ambulatory Visit: Payer: Self-pay

## 2023-04-01 ENCOUNTER — Encounter: Payer: Self-pay | Admitting: Family Medicine

## 2023-04-01 ENCOUNTER — Ambulatory Visit (INDEPENDENT_AMBULATORY_CARE_PROVIDER_SITE_OTHER): Payer: 59 | Admitting: Family Medicine

## 2023-04-01 VITALS — BP 114/78 | HR 96 | Ht 67.0 in | Wt 200.0 lb

## 2023-04-01 DIAGNOSIS — M9908 Segmental and somatic dysfunction of rib cage: Secondary | ICD-10-CM

## 2023-04-01 DIAGNOSIS — M25511 Pain in right shoulder: Secondary | ICD-10-CM

## 2023-04-01 DIAGNOSIS — M9901 Segmental and somatic dysfunction of cervical region: Secondary | ICD-10-CM | POA: Diagnosis not present

## 2023-04-01 DIAGNOSIS — M9902 Segmental and somatic dysfunction of thoracic region: Secondary | ICD-10-CM | POA: Diagnosis not present

## 2023-04-01 DIAGNOSIS — M75111 Incomplete rotator cuff tear or rupture of right shoulder, not specified as traumatic: Secondary | ICD-10-CM | POA: Diagnosis not present

## 2023-04-01 DIAGNOSIS — M999 Biomechanical lesion, unspecified: Secondary | ICD-10-CM

## 2023-04-01 NOTE — Assessment & Plan Note (Signed)
Still working on posture and this.  Discussed icing regimen exercises, discussed which activities which ones to avoid, increase activity slowly otherwise.  Follow-up again in 6 to 8 weeks.

## 2023-04-01 NOTE — Assessment & Plan Note (Signed)
Patient is a exam but does seem to be healing already which is good following up again in 3 weeks to further evaluate

## 2023-04-01 NOTE — Assessment & Plan Note (Signed)
   Decision today to treat with OMT was based on Physical Exam  After verbal consent patient was treated with HVLA, ME, FPR techniques in cervical, thoracic, rib,areas, all areas are chronic   Patient tolerated the procedure well with improvement in symptoms  Patient given exercises, stretches and lifestyle modifications  See medications in patient instructions if given  Patient will follow up in 4-8 weeks 

## 2023-04-24 NOTE — Progress Notes (Signed)
Melissa [name] [address] [place] [address] Phone: [phone] Subjective:   Melissa [name], am serving as a scribe for Dr. Antoine [name].  I'm seeing this patient by the request  of:  Melissa Bears, MD  CC: Neck pain, shoulder pain follow-up  [id number]  Melissa [name] is a 52 y.o. female coming in with complaint of back and neck pain. OMT on 04/01/2023. Also seen for R shoulder pain. Patient states feel better.  Patient still states that she is not 100% but significantly better than what she was previously.         Reviewed prior external information including notes and imaging from previsou exam, outside providers and external EMR if available.   As well as notes that were available from care everywhere and other healthcare systems.  Past medical history, social, surgical and family history all reviewed in electronic medical record.  No pertanent information unless stated regarding to the chief complaint.   Past Medical History:  Diagnosis Date   Anxiety    Bell's palsy    Cancer (HCC)    melanoma (left leg behind knee) & basal cell (rt hip)   Migraines    STD (sexually transmitted disease)    HSV2    Allergies  Allergen Reactions   Shellfish Allergy Nausea And Vomiting and Rash     Review of Systems:  No headache, visual changes, nausea, vomiting, diarrhea, constipation, dizziness, abdominal pain, skin rash, fevers, chills, night sweats, weight loss, swollen lymph nodes, body aches, joint swelling, chest pain, shortness of breath, mood changes. POSITIVE muscle aches  Objective  Blood pressure 112/80, pulse (!) 102, height 5\' 7"  (1.702 m), weight 194 lb (88 kg), SpO2 97%.   General: No apparent distress alert and oriented x3 mood and affect normal, dressed appropriately.  HEENT: Pupils equal, extraocular movements intact  Respiratory: Patient's speak in full sentences and does not appear short of breath   Cardiovascular: No lower extremity edema, non tender, no erythema  Gait MSK:  Right shoulder exam shows the patient does have some tenderness to palpation in the paraspinal musculature still.  Patient though does have improvement in strength of the rotator cuff. Back does have some loss lordosis noted.  Some tenderness to palpation in the paraspinal musculature.  Tightness with straight leg test noted.  Limited muscular skeletal ultrasound was performed and interpreted by Melissa [name], M  Limited ultrasound shows the patient does have hypoechoic changes noted.  Patient's rotator cuff does have some scar tissue formation that does seem to be significantly improved from previous exam.  Osteopathic findings  C2 flexed rotated and side bent right C5 flexed rotated and side bent left T3 extended rotated and side bent right inhaled rib T7 extended rotated and side bent left L1 flexed rotated and side bent right Sacrum right on right    Assessment and Plan:  Rotator cuff tear, right Significant improvement noted on ultrasound already today.  Patient is ahead of schedule at the moment.  We discussed with patient about icing regimen and home exercises otherwise.  Increase activity slowly.  Follow-up again in 6 to 8 weeks  Trigger point of right shoulder region Has a trigger points in the previously.  Has responded well though to osteopathic manipulation.  Do think it is multifactorial.  Increase activity slowly.  Discussed icing regimen and home exercises.  Follow-up again in 6 to 8 weeks.    Nonallopathic problems  Decision today to treat  with OMT was based on Physical Exam  After verbal consent patient was treated with HVLA, ME, FPR techniques in cervical, rib, thoracic, lumbar, and sacral  areas  Patient tolerated the procedure well with improvement in symptoms  Patient given exercises, stretches and lifestyle modifications  See medications in patient instructions if  given  Patient will follow up in 4-8 weeks     The above documentation has been reviewed and is accurate and complete Melissa Saa, DO         Note: This dictation was prepared with Dragon dictation along with smaller phrase technology. Any transcriptional errors that result from this process are unintentional.

## 2023-04-28 ENCOUNTER — Other Ambulatory Visit: Payer: Self-pay

## 2023-04-28 ENCOUNTER — Ambulatory Visit (INDEPENDENT_AMBULATORY_CARE_PROVIDER_SITE_OTHER): Payer: 59 | Admitting: Family Medicine

## 2023-04-28 ENCOUNTER — Encounter: Payer: Self-pay | Admitting: Family Medicine

## 2023-04-28 VITALS — BP 112/80 | HR 102 | Ht 67.0 in | Wt 194.0 lb

## 2023-04-28 DIAGNOSIS — M9904 Segmental and somatic dysfunction of sacral region: Secondary | ICD-10-CM | POA: Diagnosis not present

## 2023-04-28 DIAGNOSIS — M9903 Segmental and somatic dysfunction of lumbar region: Secondary | ICD-10-CM

## 2023-04-28 DIAGNOSIS — M25511 Pain in right shoulder: Secondary | ICD-10-CM

## 2023-04-28 DIAGNOSIS — M9902 Segmental and somatic dysfunction of thoracic region: Secondary | ICD-10-CM

## 2023-04-28 DIAGNOSIS — M75111 Incomplete rotator cuff tear or rupture of right shoulder, not specified as traumatic: Secondary | ICD-10-CM

## 2023-04-28 DIAGNOSIS — M9908 Segmental and somatic dysfunction of rib cage: Secondary | ICD-10-CM | POA: Diagnosis not present

## 2023-04-28 DIAGNOSIS — M9901 Segmental and somatic dysfunction of cervical region: Secondary | ICD-10-CM | POA: Diagnosis not present

## 2023-04-28 NOTE — Patient Instructions (Signed)
Good to see you! Tell Geogia hi See you again in 7-8 weeks

## 2023-04-28 NOTE — Assessment & Plan Note (Signed)
Significant improvement noted on ultrasound already today.  Patient is ahead of schedule at the moment.  We discussed with patient about icing regimen and home exercises otherwise.  Increase activity slowly.  Follow-up again in 6 to 8 weeks

## 2023-04-28 NOTE — Assessment & Plan Note (Signed)
Has a trigger points in the previously.  Has responded well though to osteopathic manipulation.  Do think it is multifactorial.  Increase activity slowly.  Discussed icing regimen and home exercises.  Follow-up again in 6 to 8 weeks.

## 2023-06-12 NOTE — Progress Notes (Signed)
[facility] [address] [place] [address] Phone: [phone] Subjective:   Melissa [name], am serving as a scribe for Dr. Antoine Primas.  I'm seeing this patient by the request  of:  Jerene Bears, MD  CC: Back and neck pain follow-up  [id number]  Melissa [name] is a 52 y.o. female coming in with complaint of back and neck pain. OMT 04/28/2023. Also f/u for R shoulder pain. Patient states shoulder is doing okay. No other concerns.  Medications patient has been prescribed: None  Taking:     Patient's previous MRI of the right shoulder show the patient did have some moderate arthritic changes in the glenohumeral joint and a partial-thickness tear noted of the supraspinatus.  This was low-grade.  Patient did do PRP and at last follow-up did show significant healing.    Reviewed prior external information including notes and imaging from previsou exam, outside providers and external EMR if available.   As well as notes that were available from care everywhere and other healthcare systems.  Past medical history, social, surgical and family history all reviewed in electronic medical record.  No pertanent information unless stated regarding to the chief complaint.   Past Medical History:  Diagnosis Date   Anxiety    Bell's palsy    Cancer (HCC)    melanoma (left leg behind knee) & basal cell (rt hip)   Migraines    STD (sexually transmitted disease)    HSV2    Allergies  Allergen Reactions   Shellfish Allergy Nausea And Vomiting and Rash     Review of Systems:  No headache, visual changes, nausea, vomiting, diarrhea, constipation, dizziness, abdominal pain, skin rash, fevers, chills, night sweats, weight loss, swollen lymph nodes, body aches, joint swelling, chest pain, shortness of breath, mood changes. POSITIVE muscle aches  Objective  Pulse 89, height 5\' 7"  (1.702 m), weight 197 lb (89.4 kg), SpO2 98%.   General: No  apparent distress alert and oriented x3 mood and affect normal, dressed appropriately.  HEENT: Pupils equal, extraocular movements intact  Respiratory: Patient's speak in full sentences and does not appear short of breath  Cardiovascular: No lower extremity edema, non tender, no erythema  MSK:  Back exam shows the patient does have some loss of lordosis.  Tightness in the parascapular area.  Seems to be right greater than left.  Osteopathic findings  C2 flexed rotated and side bent right C6 flexed rotated and side bent left T3 extended rotated and side bent right inhaled rib T5 extended rotated and side bent left L3 flexed rotated and side bent right Sacrum right on right     Assessment and Plan:  Chronic shoulder bursitis, right Patient was hold on any type of injection but I do think it may be necessary.  Rotator cuff tear is completely healed at the moment.  Patient now is doing much better and did respond well to osteopathic manipulation.  Increase activity slowly otherwise.  Follow-up again in 6 to 8 weeks.    Nonallopathic problems  Decision today to treat with OMT was based on Physical Exam  After verbal consent patient was treated with HVLA, ME, FPR techniques in cervical, rib, thoracic, lumbar, and sacral  areas  Patient tolerated the procedure well with improvement in symptoms  Patient given exercises, stretches and lifestyle modifications  See medications in patient instructions if given  Patient will follow up in 4-8 weeks    The above documentation has  been reviewed and is accurate and complete Melissa Saa, DO          Note: This dictation was prepared with Dragon dictation along with smaller phrase technology. Any transcriptional errors that result from this process are unintentional.

## 2023-06-16 ENCOUNTER — Other Ambulatory Visit (HOSPITAL_COMMUNITY): Payer: Self-pay

## 2023-06-16 ENCOUNTER — Other Ambulatory Visit: Payer: Self-pay

## 2023-06-16 ENCOUNTER — Ambulatory Visit (INDEPENDENT_AMBULATORY_CARE_PROVIDER_SITE_OTHER): Payer: 59 | Admitting: Family Medicine

## 2023-06-16 ENCOUNTER — Encounter: Payer: Self-pay | Admitting: Family Medicine

## 2023-06-16 VITALS — HR 89 | Ht 67.0 in | Wt 197.0 lb

## 2023-06-16 DIAGNOSIS — M7551 Bursitis of right shoulder: Secondary | ICD-10-CM

## 2023-06-16 DIAGNOSIS — M9903 Segmental and somatic dysfunction of lumbar region: Secondary | ICD-10-CM

## 2023-06-16 DIAGNOSIS — M9901 Segmental and somatic dysfunction of cervical region: Secondary | ICD-10-CM | POA: Diagnosis not present

## 2023-06-16 DIAGNOSIS — M9902 Segmental and somatic dysfunction of thoracic region: Secondary | ICD-10-CM

## 2023-06-16 DIAGNOSIS — M25511 Pain in right shoulder: Secondary | ICD-10-CM | POA: Diagnosis not present

## 2023-06-16 DIAGNOSIS — M9908 Segmental and somatic dysfunction of rib cage: Secondary | ICD-10-CM

## 2023-06-16 DIAGNOSIS — M9904 Segmental and somatic dysfunction of sacral region: Secondary | ICD-10-CM

## 2023-06-16 MED ORDER — MELOXICAM 15 MG PO TABS
15.0000 mg | ORAL_TABLET | Freq: Every day | ORAL | 0 refills | Status: AC
  Filled 2023-06-16: qty 90, 90d supply, fill #0

## 2023-06-16 NOTE — Patient Instructions (Addendum)
Meloxicam 15mg  for 10 days, then take in 5 days burst as needed Ice with activity See you again in 8-10 weeks

## 2023-06-16 NOTE — Assessment & Plan Note (Signed)
Patient was hold on any type of injection but I do think it may be necessary.  Rotator cuff tear is completely healed at the moment.  Patient now is doing much better and did respond well to osteopathic manipulation.  Increase activity slowly otherwise.  Follow-up again in 6 to 8 weeks.

## 2023-06-19 ENCOUNTER — Other Ambulatory Visit (HOSPITAL_COMMUNITY): Payer: Self-pay

## 2023-09-02 ENCOUNTER — Other Ambulatory Visit: Payer: Self-pay | Admitting: Obstetrics & Gynecology

## 2023-09-02 DIAGNOSIS — Z1231 Encounter for screening mammogram for malignant neoplasm of breast: Secondary | ICD-10-CM

## 2023-09-04 ENCOUNTER — Ambulatory Visit (HOSPITAL_BASED_OUTPATIENT_CLINIC_OR_DEPARTMENT_OTHER): Payer: Commercial Managed Care - PPO | Admitting: Obstetrics & Gynecology

## 2023-09-18 ENCOUNTER — Ambulatory Visit
Admission: RE | Admit: 2023-09-18 | Discharge: 2023-09-18 | Disposition: A | Discharge status: Home / Self Care | Payer: 59 | Source: Ambulatory Visit

## 2023-09-18 DIAGNOSIS — Z1231 Encounter for screening mammogram for malignant neoplasm of breast: Secondary | ICD-10-CM

## 2023-10-21 ENCOUNTER — Ambulatory Visit (HOSPITAL_BASED_OUTPATIENT_CLINIC_OR_DEPARTMENT_OTHER): Payer: 59 | Admitting: Obstetrics & Gynecology

## 2023-12-31 ENCOUNTER — Other Ambulatory Visit (HOSPITAL_COMMUNITY)
Admission: RE | Admit: 2023-12-31 | Discharge: 2023-12-31 | Disposition: A | Discharge status: Home / Self Care | Source: Ambulatory Visit | Attending: Obstetrics & Gynecology | Admitting: Obstetrics & Gynecology

## 2023-12-31 ENCOUNTER — Encounter (HOSPITAL_BASED_OUTPATIENT_CLINIC_OR_DEPARTMENT_OTHER): Payer: Self-pay | Admitting: Obstetrics & Gynecology

## 2023-12-31 ENCOUNTER — Ambulatory Visit (HOSPITAL_BASED_OUTPATIENT_CLINIC_OR_DEPARTMENT_OTHER): Admitting: Obstetrics & Gynecology

## 2023-12-31 VITALS — BP 134/90 | HR 77 | Ht 67.0 in | Wt 196.2 lb

## 2023-12-31 DIAGNOSIS — Z124 Encounter for screening for malignant neoplasm of cervix: Secondary | ICD-10-CM

## 2023-12-31 DIAGNOSIS — Z01419 Encounter for gynecological examination (general) (routine) without abnormal findings: Secondary | ICD-10-CM

## 2023-12-31 DIAGNOSIS — Z01411 Encounter for gynecological examination (general) (routine) with abnormal findings: Secondary | ICD-10-CM

## 2023-12-31 DIAGNOSIS — B009 Herpesviral infection, unspecified: Secondary | ICD-10-CM

## 2023-12-31 DIAGNOSIS — Z6830 Body mass index (BMI) 30.0-30.9, adult: Secondary | ICD-10-CM | POA: Diagnosis not present

## 2023-12-31 DIAGNOSIS — E559 Vitamin D deficiency, unspecified: Secondary | ICD-10-CM

## 2023-12-31 NOTE — Progress Notes (Signed)
   ANNUAL EXAM Patient name: Melissa [name] MRN [id number]  Date of birth: [birth year] Chief Complaint:   Annual Exam  History of Present Illness:   Melissa [name] is a 53 y.o. G2P0 Caucasian female being seen today for a routine annual exam.  Doing well.  Still having vasomotor symptoms including hot flashes.  These are better.    No LMP recorded. Patient is postmenopausal.   Last pap  05/22/2020. Results were: NILM w/ HRHPV negative. H/O abnormal pap: no Last mammogram: 09/18/2023 . Results were: normal. Family h/o breast cancer: no Last colonoscopy: 10/18/2020. F/u 10 years.  Results were: normal. Family h/o colorectal cancer: no     12/31/2023    8:41 AM 08/22/2022    8:32 AM 08/16/2021   11:54 AM  Depression screen PHQ 2/9  Decreased Interest 0 0 0  Down, Depressed, Hopeless 0 0 0  PHQ - 2 Score 0 0 0     Review of Systems:   Pertinent items are noted in HPI  Denies any bowel changes.  She does have some mild urinary incontinence.   Pertinent History Reviewed:  Reviewed past medical,surgical, social and family history.  Reviewed problem list, medications and allergies. Physical Assessment:   Vitals:   12/31/23 0840  BP: (!) 134/90  Pulse: 77  Weight: 196 lb 3.2 oz (89 kg)  Height: 5\' 7"  (1.702 m)  Body mass index is 30.73 kg/m.        Physical Examination:   General appearance - well appearing, and in no distress  Mental status - alert, oriented to person, place, and time  Psych:  She has a normal mood and affect  Skin - warm and dry, normal color, no suspicious lesions noted  Chest - effort normal, all lung fields clear to auscultation bilaterally  Heart - normal rate and regular rhythm  Neck:  midline trachea, no thyromegaly or nodules  Breasts - breasts appear normal, no suspicious masses, no skin or nipple changes or axillary nodes  Abdomen - soft, nontender, nondistended, no masses or organomegaly  Pelvic - VULVA: normal appearing vulva with no  masses, tenderness or lesions   VAGINA: normal appearing vagina with normal color and discharge, no lesions   CERVIX: normal appearing cervix without discharge or lesions, no CMT  Thin prep pap is done with HR HPV cotesting  UTERUS: uterus is felt to be normal size, shape, consistency and nontender   ADNEXA: No adnexal masses or tenderness noted.  Rectal - normal rectal, good sphincter tone, no masses felt.  Extremities:  No swelling or varicosities noted  Chaperone present for exam  Assessment & Plan:  1. Well woman exam with routine gynecological exam (Primary) - Pap smear obtained today - Mammogram 2/12/205 - Colonoscopy 2022.  Follow up 10 years.   - lab work updated today:  CMP, lipids, TSH and Vit D and HbA1c - vaccines reviewed/updated  2. Cervical cancer screening - Cytology - PAP( )  3. HSV-2 (herpes simplex virus 2) infection - does not need refill   No orders of the defined types were placed in this encounter.   Meds: No orders of the defined types were placed in this encounter.   Follow-up: Return in about 1 year (around 12/30/2024).  Lillian Rein, MD 12/31/2023 9:34 AM

## 2024-01-01 LAB — VITAMIN D 25 HYDROXY (VIT D DEFICIENCY, FRACTURES): Vit D, 25-Hydroxy: 65.2 ng/mL (ref 30.0–100.0)

## 2024-01-01 LAB — COMPREHENSIVE METABOLIC PANEL WITH GFR
ALT: 26 IU/L (ref 0–32)
AST: 18 IU/L (ref 0–40)
Albumin: 4.8 g/dL (ref 3.8–4.9)
Alkaline Phosphatase: 67 IU/L (ref 44–121)
BUN/Creatinine Ratio: 18 (ref 9–23)
BUN: 14 mg/dL (ref 6–24)
Bilirubin Total: 0.3 mg/dL (ref 0.0–1.2)
CO2: 21 mmol/L (ref 20–29)
Calcium: 10.1 mg/dL (ref 8.7–10.2)
Chloride: 100 mmol/L (ref 96–106)
Creatinine, Ser: 0.76 mg/dL (ref 0.57–1.00)
Globulin, Total: 2.5 g/dL (ref 1.5–4.5)
Glucose: 87 mg/dL (ref 70–99)
Potassium: 4.8 mmol/L (ref 3.5–5.2)
Sodium: 139 mmol/L (ref 134–144)
Total Protein: 7.3 g/dL (ref 6.0–8.5)
eGFR: 94 mL/min/{1.73_m2} (ref >59)

## 2024-01-01 LAB — HEMOGLOBIN A1C
Est. average glucose Bld gHb Est-mCnc: 108 mg/dL
Hgb A1c MFr Bld: 5.4 % (ref 4.8–5.6)

## 2024-01-01 LAB — LIPID PANEL
Chol/HDL Ratio: 3.8 ratio (ref 0.0–4.4)
Cholesterol, Total: 201 mg/dL — ABNORMAL HIGH (ref 100–199)
HDL: 53 mg/dL (ref >39)
LDL Chol Calc (NIH): 118 mg/dL — ABNORMAL HIGH (ref 0–99)
Triglycerides: 171 mg/dL — ABNORMAL HIGH (ref 0–149)
VLDL Cholesterol Cal: 30 mg/dL (ref 5–40)

## 2024-01-01 LAB — TSH: TSH: 1.71 u[IU]/mL (ref 0.450–4.500)

## 2024-01-02 ENCOUNTER — Ambulatory Visit (HOSPITAL_BASED_OUTPATIENT_CLINIC_OR_DEPARTMENT_OTHER): Payer: Self-pay | Admitting: Obstetrics & Gynecology

## 2024-01-05 LAB — CYTOLOGY - PAP
Comment: NEGATIVE
Diagnosis: NEGATIVE
High risk HPV: NEGATIVE

## 2024-01-06 DIAGNOSIS — L821 Other seborrheic keratosis: Secondary | ICD-10-CM | POA: Diagnosis not present

## 2024-03-30 ENCOUNTER — Other Ambulatory Visit (HOSPITAL_BASED_OUTPATIENT_CLINIC_OR_DEPARTMENT_OTHER): Payer: Self-pay | Admitting: Obstetrics & Gynecology

## 2024-03-30 DIAGNOSIS — Z86018 Personal history of other benign neoplasm: Secondary | ICD-10-CM | POA: Diagnosis not present

## 2024-03-30 DIAGNOSIS — D2239 Melanocytic nevi of other parts of face: Secondary | ICD-10-CM | POA: Diagnosis not present

## 2024-03-30 DIAGNOSIS — L578 Other skin changes due to chronic exposure to nonionizing radiation: Secondary | ICD-10-CM | POA: Diagnosis not present

## 2024-03-30 DIAGNOSIS — L814 Other melanin hyperpigmentation: Secondary | ICD-10-CM | POA: Diagnosis not present

## 2024-03-30 DIAGNOSIS — L821 Other seborrheic keratosis: Secondary | ICD-10-CM | POA: Diagnosis not present

## 2024-03-30 DIAGNOSIS — D225 Melanocytic nevi of trunk: Secondary | ICD-10-CM | POA: Diagnosis not present

## 2024-03-30 DIAGNOSIS — B078 Other viral warts: Secondary | ICD-10-CM | POA: Diagnosis not present

## 2024-03-30 DIAGNOSIS — B009 Herpesviral infection, unspecified: Secondary | ICD-10-CM

## 2024-03-30 DIAGNOSIS — Z85820 Personal history of malignant melanoma of skin: Secondary | ICD-10-CM | POA: Diagnosis not present

## 2024-03-31 ENCOUNTER — Other Ambulatory Visit (HOSPITAL_COMMUNITY): Payer: Self-pay

## 2024-03-31 MED ORDER — VALACYCLOVIR HCL 500 MG PO TABS
ORAL_TABLET | ORAL | 3 refills | Status: AC
  Filled 2024-03-31: qty 6, 3d supply, fill #0
  Filled 2024-07-08: qty 6, 3d supply, fill #1

## 2024-04-01 ENCOUNTER — Other Ambulatory Visit (HOSPITAL_COMMUNITY): Payer: Self-pay

## 2025-01-06 ENCOUNTER — Ambulatory Visit (HOSPITAL_BASED_OUTPATIENT_CLINIC_OR_DEPARTMENT_OTHER): Admitting: Obstetrics & Gynecology
